# Patient Record
Sex: Male | Born: 1960 | Race: White | Hispanic: No | Marital: Married | Smoking: Never smoker
Health system: Southern US, Academic
[De-identification: ages and names within clinical notes are randomized; demographics above are authoritative.]

## PROBLEM LIST (undated history)

## (undated) DIAGNOSIS — N200 Calculus of kidney: Secondary | ICD-10-CM

## (undated) DIAGNOSIS — Z87448 Personal history of other diseases of urinary system: Secondary | ICD-10-CM

## (undated) DIAGNOSIS — M109 Gout, unspecified: Secondary | ICD-10-CM

## (undated) DIAGNOSIS — I10 Essential (primary) hypertension: Secondary | ICD-10-CM

## (undated) DIAGNOSIS — E785 Hyperlipidemia, unspecified: Secondary | ICD-10-CM

## (undated) DIAGNOSIS — E119 Type 2 diabetes mellitus without complications: Secondary | ICD-10-CM

## (undated) DIAGNOSIS — Z973 Presence of spectacles and contact lenses: Secondary | ICD-10-CM

## (undated) DIAGNOSIS — E7800 Pure hypercholesterolemia, unspecified: Secondary | ICD-10-CM

## (undated) DIAGNOSIS — I209 Angina pectoris, unspecified: Secondary | ICD-10-CM

## (undated) HISTORY — PX: HX WISDOM TEETH EXTRACTION: SHX21

## (undated) HISTORY — PX: FINGER SURGERY: SHX640

## (undated) HISTORY — PX: COLONOSCOPY: SHX174

## (undated) HISTORY — PX: LITHOTRIPSY: SUR834

## (undated) HISTORY — PX: REPLANTATION FINGER: SUR1229

---

## 1998-02-28 ENCOUNTER — Emergency Department (HOSPITAL_COMMUNITY): Admission: EM | Admit: 1998-02-28 | Discharge: 1998-02-28 | Payer: Self-pay | Admitting: *Deleted

## 1998-02-28 ENCOUNTER — Encounter: Payer: Self-pay | Admitting: Emergency Medicine

## 1998-03-19 ENCOUNTER — Encounter: Payer: Self-pay | Admitting: Orthopedic Surgery

## 1998-03-19 ENCOUNTER — Ambulatory Visit (HOSPITAL_COMMUNITY): Admission: RE | Admit: 1998-03-19 | Discharge: 1998-03-19 | Payer: Self-pay | Admitting: Orthopedic Surgery

## 2001-11-23 ENCOUNTER — Encounter: Payer: Self-pay | Admitting: Orthopedic Surgery

## 2001-11-23 ENCOUNTER — Ambulatory Visit (HOSPITAL_COMMUNITY): Admission: RE | Admit: 2001-11-23 | Discharge: 2001-11-23 | Payer: Self-pay | Admitting: Orthopedic Surgery

## 2002-10-24 ENCOUNTER — Emergency Department (HOSPITAL_COMMUNITY): Admission: EM | Admit: 2002-10-24 | Discharge: 2002-10-24 | Payer: Self-pay | Admitting: Emergency Medicine

## 2002-10-24 ENCOUNTER — Encounter: Payer: Self-pay | Admitting: Emergency Medicine

## 2004-07-26 ENCOUNTER — Encounter: Admission: RE | Admit: 2004-07-26 | Discharge: 2004-07-26 | Payer: Self-pay | Admitting: Rheumatology

## 2007-02-23 ENCOUNTER — Ambulatory Visit: Payer: Self-pay | Admitting: Surgery

## 2007-02-23 ENCOUNTER — Ambulatory Visit (HOSPITAL_COMMUNITY): Admission: RE | Admit: 2007-02-23 | Discharge: 2007-02-23 | Payer: Self-pay | Admitting: Family Medicine

## 2010-05-29 ENCOUNTER — Emergency Department (HOSPITAL_COMMUNITY)
Admission: EM | Admit: 2010-05-29 | Discharge: 2010-05-29 | Disposition: A | Discharge status: Home / Self Care | Payer: BLUE CROSS/BLUE SHIELD | Attending: Emergency Medicine | Admitting: Emergency Medicine

## 2010-05-29 ENCOUNTER — Emergency Department (HOSPITAL_COMMUNITY): Payer: BLUE CROSS/BLUE SHIELD

## 2010-05-29 DIAGNOSIS — R300 Dysuria: Secondary | ICD-10-CM | POA: Insufficient documentation

## 2010-05-29 DIAGNOSIS — K5730 Diverticulosis of large intestine without perforation or abscess without bleeding: Secondary | ICD-10-CM | POA: Insufficient documentation

## 2010-05-29 DIAGNOSIS — K4090 Unilateral inguinal hernia, without obstruction or gangrene, not specified as recurrent: Secondary | ICD-10-CM | POA: Insufficient documentation

## 2010-05-29 DIAGNOSIS — N200 Calculus of kidney: Secondary | ICD-10-CM | POA: Insufficient documentation

## 2010-05-29 DIAGNOSIS — R109 Unspecified abdominal pain: Secondary | ICD-10-CM | POA: Insufficient documentation

## 2010-05-29 LAB — URINALYSIS, ROUTINE W REFLEX MICROSCOPIC
Bilirubin Urine: NEGATIVE
Glucose, UA: NEGATIVE mg/dL
Ketones, ur: NEGATIVE mg/dL
Leukocytes, UA: NEGATIVE
Nitrite: NEGATIVE
Protein, ur: NEGATIVE mg/dL
Specific Gravity, Urine: 1.018 (ref 1.005–1.030)
Urobilinogen, UA: 0.2 mg/dL (ref 0.0–1.0)
pH: 5.5 (ref 5.0–8.0)

## 2010-05-29 LAB — URINE MICROSCOPIC-ADD ON

## 2010-07-08 NOTE — H&P (Signed)
NAME:  [name], [name]                      ACCOUNT NO.:  [id number]   MEDICAL RECORD NO.:  [id number]                   PATIENT TYPE:  EMS   LOCATION:  [facility]                                 FACILITY:  [facility]   PHYSICIAN:  [name], M.D.             DATE OF BIRTH:  01/20/1961   DATE OF ADMISSION:  10/24/2002  DATE OF DISCHARGE:  10/24/2002                                HISTORY & PHYSICAL   CHIEF COMPLAINT:  Left hand injury.   HISTORY OF PRESENT ILLNESS:  [name] is a 50 year old right hand  dominant gentleman who presents to the [facility] emergency room after  sustaining an on the job injury to his left small finger.  The patient  states at approximately 7:45 a.m. he unfortunately caught the tip of his  left small finger in a dye press at work [employer]).  He was  brought to the [facility] emergency room for further evaluation and  treatment.  His Tetanus is up to date as he states he had it two or three  years ago.   PAST MEDICAL HISTORY:  Significant for gout with some current symptoms in  his shoulders.   PAST SURGICAL HISTORY:  None.   FAMILY PHYSICIAN:  He obtains his primary care at [facility].   MEDICATIONS:  Vioxx daily.   ALLERGIES:  No known drug allergies but he is allergic to PEANUTS.   SOCIAL HISTORY:  He is single with no children.  Does not use tobacco.  He  does utilize smokeless tobacco.   FAMILY HISTORY:  Father with myocardial infarction.   REVIEW OF SYSTEMS:  Noncontributory.   PHYSICAL EXAMINATION:  VITAL SIGNS: Blood pressure is 140/96, pulse 90,  respirations 22 and temperature is 98.4.  FOCUS EXAMINATION: As this is a hand consultation visit, shows that he has  an obvious open fracture about the left small finger with a gaping skin and  soft tissue laceration noted with exposed  bone.  He is noted to have refill  to the tip, his sensation is intact, FDS and FDP are intact.   Radiographs obtained show  an open comminuted DIP fracture.   ASSESSMENT:  On the job injury sustaining an open distal interphalangeal  fracture.   PLAN:  After discussing with Mr. Aumiller the necessity for repair as well  as the risk including bleeding, infection, anesthesia, damage to normal  structures and has elected to undergo the following procedure was performed  in the emergency room setting.  1. Treatment of an open fracture.  2. Copious irrigation and debridement.  3. Nail plate removal.  4. Nail bed repair.  5. Volar flap advancement with repair of skin and soft tissues.   PROCEDURE IN DETAIL:  A local digital block utilizing 5 cc of Marcaine and 5  cc of lidocaine without epinephrine was administered.  He tolerated this  well.  The hand was then prepped  and draped in a sterile fashion, Betadine  scrub and pain was used during the sterile preparation.  Copious irrigation  and debridement of small bony particles was then performed without  difficulty.  A Penrose drain was then utilized as a tourniquet and the nail  plate was removed without difficulty.  Following this, repair of the nail  bed was performed with subsequent repair of the soft tissues utilizing 4-0  and 6-0 chromic sutures.  The patient tolerated this very well and was  placed in soft dressings and a malleable splint.   He will return to see our therapist in five days, and return to see [place] in  seven days.  During the interim he has been given a prescription for Keflex,  Robaxin and Percocet and will call us for any questions or concerns.      Karie Chimera, P.A.-C.                   [name], M.D.    BB/MEDQ  D:  10/29/2002  T:  10/30/2002  Job:  [id number]

## 2011-12-03 ENCOUNTER — Ambulatory Visit (INDEPENDENT_AMBULATORY_CARE_PROVIDER_SITE_OTHER): Payer: BLUE CROSS/BLUE SHIELD | Admitting: Family Medicine

## 2011-12-03 VITALS — BP 134/92 | HR 72 | Temp 97.9°F | Resp 16 | Ht 73.0 in | Wt 283.0 lb

## 2011-12-03 DIAGNOSIS — Z0000 Encounter for general adult medical examination without abnormal findings: Secondary | ICD-10-CM

## 2011-12-03 DIAGNOSIS — M109 Gout, unspecified: Secondary | ICD-10-CM

## 2011-12-03 LAB — POCT CBC
Granulocyte percent: 64.4 %G (ref 37–80)
HCT, POC: 50.4 % (ref 43.5–53.7)
Hemoglobin: 16.1 g/dL (ref 14.1–18.1)
Lymph, poc: 2 (ref 0.6–3.4)
MCH, POC: 30.9 pg (ref 27–31.2)
MCHC: 31.9 g/dL (ref 31.8–35.4)
MCV: 96.8 fL (ref 80–97)
MID (cbc): 0.4 (ref 0–0.9)
MPV: 9.5 fL (ref 0–99.8)
POC Granulocyte: 4.4 (ref 2–6.9)
POC LYMPH PERCENT: 29.5 %L (ref 10–50)
POC MID %: 6.1 % (ref 0–12)
Platelet Count, POC: 274 10*3/uL (ref 142–424)
RBC: 5.21 M/uL (ref 4.69–6.13)
RDW, POC: 13.1 %
WBC: 6.9 10*3/uL (ref 4.6–10.2)

## 2011-12-03 LAB — COMPREHENSIVE METABOLIC PANEL
BUN: 13 mg/dL (ref 6–23)
CO2: 29 mEq/L (ref 19–32)
Creat: 0.73 mg/dL (ref 0.50–1.35)
Glucose, Bld: 152 mg/dL — ABNORMAL HIGH (ref 70–99)
Total Bilirubin: 0.5 mg/dL (ref 0.3–1.2)
Total Protein: 7.1 g/dL (ref 6.0–8.3)

## 2011-12-03 LAB — LIPID PANEL
Cholesterol: 217 mg/dL — ABNORMAL HIGH (ref 0–200)
HDL: 39 mg/dL — ABNORMAL LOW (ref >39)
LDL Cholesterol: 140 mg/dL — ABNORMAL HIGH (ref 0–99)
Total CHOL/HDL Ratio: 5.6 Ratio
Triglycerides: 189 mg/dL — ABNORMAL HIGH (ref <150)
VLDL: 38 mg/dL (ref 0–40)

## 2011-12-03 LAB — PSA: PSA: 0.55 ng/mL (ref <=4.00)

## 2011-12-03 LAB — IFOBT (OCCULT BLOOD): IFOBT: NEGATIVE

## 2011-12-03 LAB — POCT GLYCOSYLATED HEMOGLOBIN (HGB A1C): Hemoglobin A1C: 8.9

## 2011-12-03 LAB — COMPREHENSIVE METABOLIC PANEL WITH GFR
ALT: 27 U/L (ref 0–53)
AST: 19 U/L (ref 0–37)
Albumin: 4.4 g/dL (ref 3.5–5.2)
Alkaline Phosphatase: 68 U/L (ref 39–117)
Calcium: 9.8 mg/dL (ref 8.4–10.5)
Chloride: 102 meq/L (ref 96–112)
Potassium: 4.7 meq/L (ref 3.5–5.3)
Sodium: 139 meq/L (ref 135–145)

## 2011-12-03 LAB — URIC ACID: Uric Acid, Serum: 5.1 mg/dL (ref 4.0–7.8)

## 2011-12-03 MED ORDER — FEBUXOSTAT 80 MG PO TABS
1.0000 | ORAL_TABLET | Freq: Every day | ORAL | Status: DC
Start: 2011-12-03 — End: 2012-07-09

## 2011-12-03 MED ORDER — FEBUXOSTAT 40 MG PO TABS: 80.0000 mg | ORAL_TABLET | Freq: Every day | ORAL | Status: DC

## 2011-12-03 NOTE — Progress Notes (Signed)
  [facility]:  Office Visit  Chief Complaint:  Chief Complaint  Patient presents with  . Annual Exam  . Medication Refill    HPI: Ray Young is a 51 y.o. male who complains of annual exam. No h/o colon or prostate cancer. Father has had melanoma of the face. Patient has no complaints.   Past Medical History  Diagnosis Date  . Gout   . Arthritis    Past Surgical History  Procedure Date  . Reattachment of finger     left pinky finger 2003   History   Social History  . Marital Status: Single    Spouse Name: N/A    Number of Children: N/A  . Years of Education: N/A   Social History Main Topics  . Smoking status: Never Smoker   . Smokeless tobacco: None  . Alcohol Use: No  . Drug Use: No  . Sexually Active: None   Other Topics Concern  . None   Social History Narrative  . None   Family History  Problem Relation Age of Onset  . Heart disease Father    No Known Allergies Prior to Admission medications   Medication Sig Start Date End Date Taking? Authorizing Provider  febuxostat (ULORIC) 40 MG tablet Take 80 mg by mouth daily.   Yes Historical Provider, MD  Multiple Vitamins-Minerals (CENTRUM SILVER ULTRA MENS PO) Take by mouth.   Yes Historical Provider, MD     ROS: The patient denies fevers, chills, night sweats, unintentional weight loss, chest pain, palpitations, wheezing, dyspnea on exertion, nausea, vomiting, abdominal pain, dysuria, hematuria, melena, numbness, weakness, or tingling.   All other systems have been reviewed and were otherwise negative with the exception of those mentioned in the HPI and as above.    PHYSICAL EXAM: Filed Vitals:   12/03/11 1128  BP: 134/92  Pulse: 72  Temp: 97.9 F (36.6 C)  Resp: 16   Filed Vitals:   12/03/11 1128  Height: 6\' 1"  (1.854 m)  Weight: 283 lb (128.368 kg)   Body mass index is 37.34 kg/(m^2).  General: Alert, no acute distress, obese white male HEENT:  Normocephalic,  atraumatic, oropharynx patent. EOMI, PERRLA, fundoscopic exam nl Cardiovascular:  Regular rate and rhythm, no rubs murmurs or gallops.  No Carotid bruits, radial pulse intact. No pedal edema.  Respiratory: Clear to auscultation bilaterally.  No wheezes, rales, or rhonchi.  No cyanosis, no use of accessory musculature GI: No organomegaly, abdomen is soft and non-tender, positive bowel sounds.  No masses. Skin: No rashes. Neurologic: Facial musculature symmetric. Psychiatric: Patient is appropriate throughout our interaction. Lymphatic: No cervical lymphadenopathy Musculoskeletal: Gait intact. GU -nl, rectum nl, prostate nl   LABS:    EKG/XRAY:   Primary read interpreted by Dr. Conley Rolls at [facility].   ASSESSMENT/PLAN: Encounter Diagnoses  Name Primary?  . Annual physical exam Yes  . Gout    Patient doing well.  Labs pending Refer to get screening colonoscopy Patient take Uloric 80 mg daily. Refill x 11    Odis Turck PHUONG, DO 12/04/2011 12:00 PM

## 2011-12-04 ENCOUNTER — Telehealth: Payer: Self-pay | Admitting: Radiology

## 2011-12-04 MED ORDER — METFORMIN HCL 1000 MG PO TABS: 1000.0000 mg | ORAL_TABLET | Freq: Two times a day (BID) | ORAL | Status: DC

## 2011-12-04 NOTE — Telephone Encounter (Signed)
Patient called and advised labs indicate Diabetes, he is advised the Rx for Metformin sent in for him and he needs return office visit in 3 mo. He is advised meds may cause GI upset/ Nausea and diarrhea, he is advised taking with food may help this and he will adjust to this, and GI side effects will decrease with time.

## 2011-12-22 ENCOUNTER — Encounter: Payer: Self-pay | Admitting: Family Medicine

## 2011-12-26 ENCOUNTER — Other Ambulatory Visit: Payer: Self-pay | Admitting: Family Medicine

## 2011-12-26 DIAGNOSIS — E785 Hyperlipidemia, unspecified: Secondary | ICD-10-CM

## 2011-12-26 MED ORDER — ATORVASTATIN CALCIUM 40 MG PO TABS: 40.0000 mg | ORAL_TABLET | Freq: Every day | ORAL | Status: DC

## 2012-03-03 IMAGING — CT CT ABD-PELV W/O CM
2 of 4 series · 16 of 46 positions shown, 18 images · non-contrast
Comparison: None.

CLINICAL DATA: Acute onset of right lower quadrant abdominal pain

CT ABDOMEN AND PELVIS WITHOUT CONTRAST
TECHNIQUE: Multidetector CT imaging of the abdomen and pelvis was
performed following the standard protocol without intravenous
contrast.

[Series 2: stone study · axial · 0.88mm/px · z∈[-553,-58]mm · 13 of 111 slices shown, 15 images]
[im 6/111  soft-tissue]
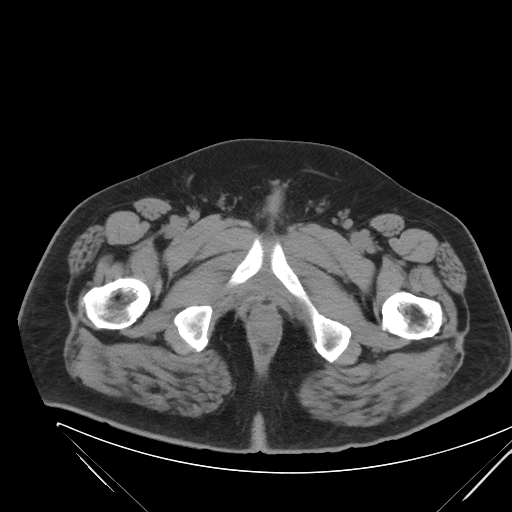
[im 6/111  bone]
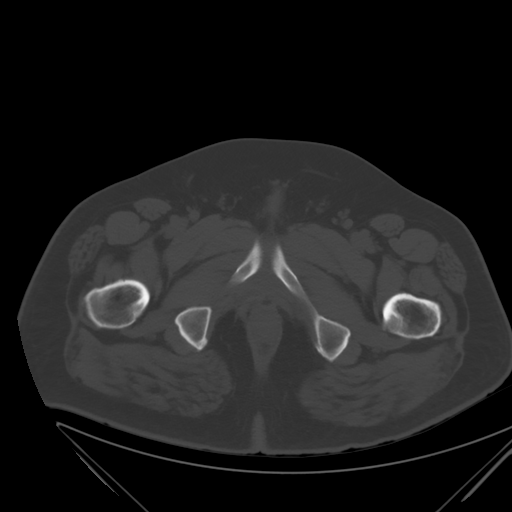
[im 18/111  soft-tissue]
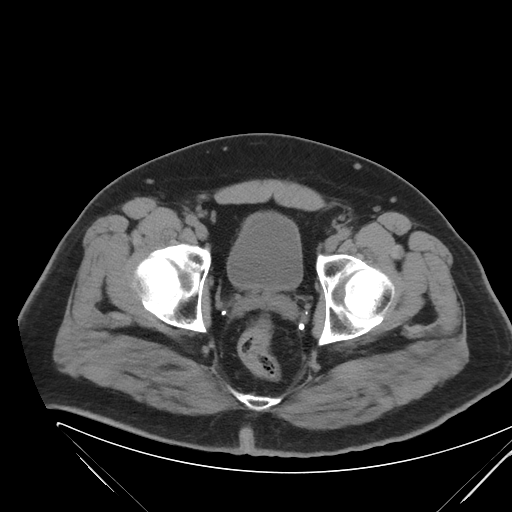
[im 24/111  soft-tissue]
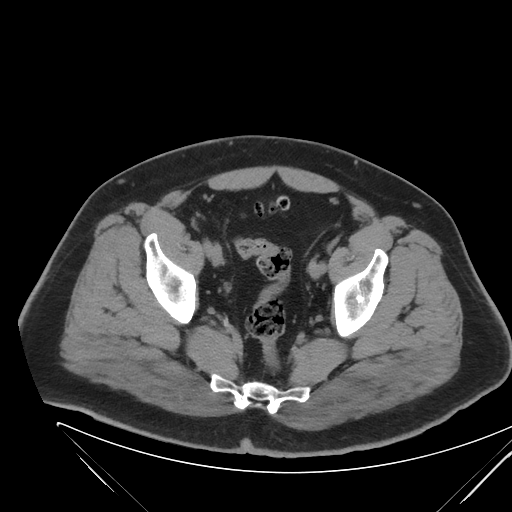
[im 29/111  soft-tissue]
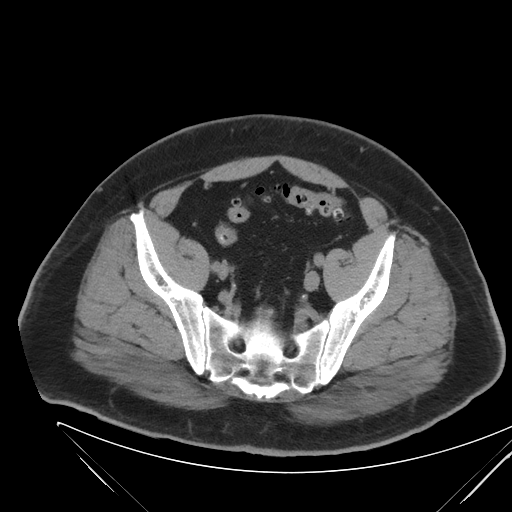
[im 41/111  soft-tissue]
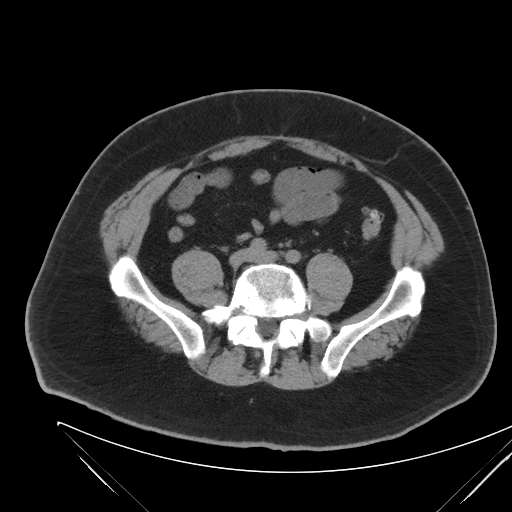
[im 47/111  soft-tissue]
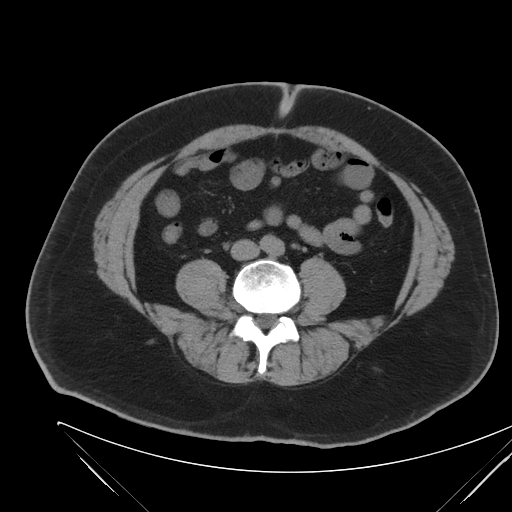
[im 58/111  soft-tissue]
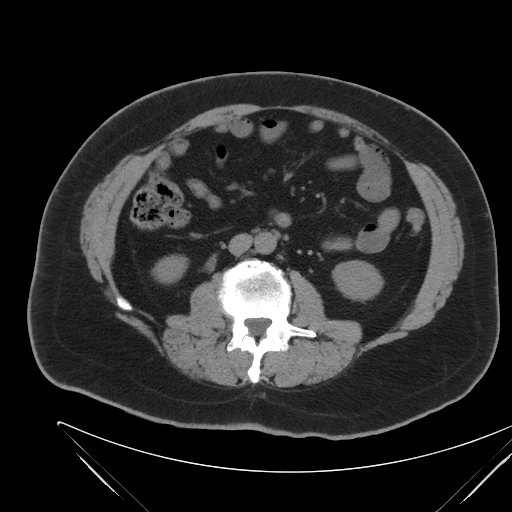
[im 64/111  soft-tissue]
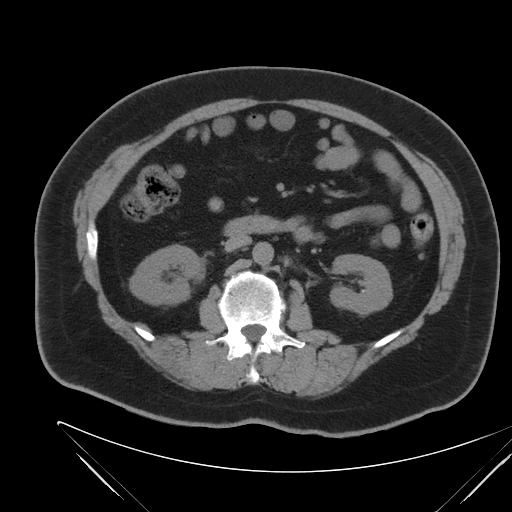
[im 70/111  soft-tissue]
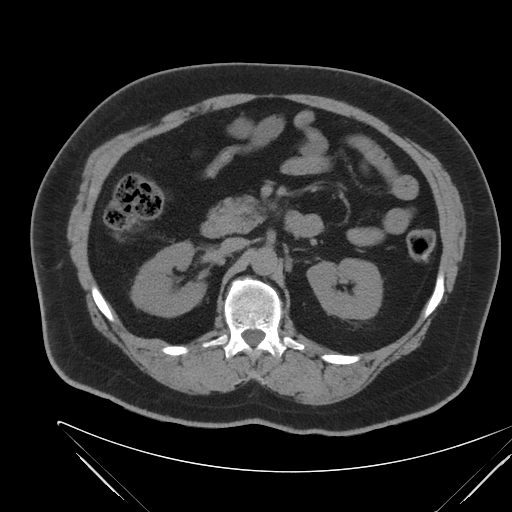
[im 70/111  bone]
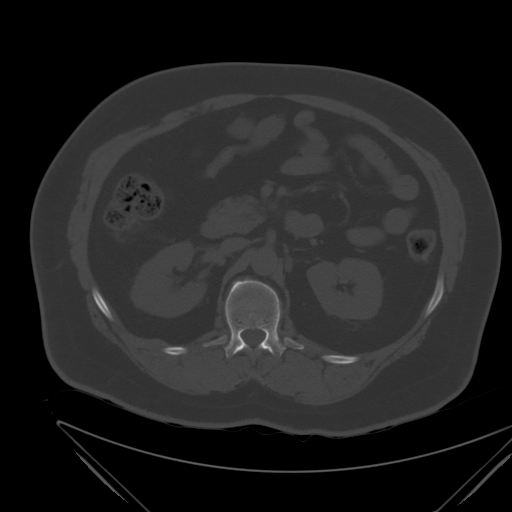
[im 82/111  soft-tissue]
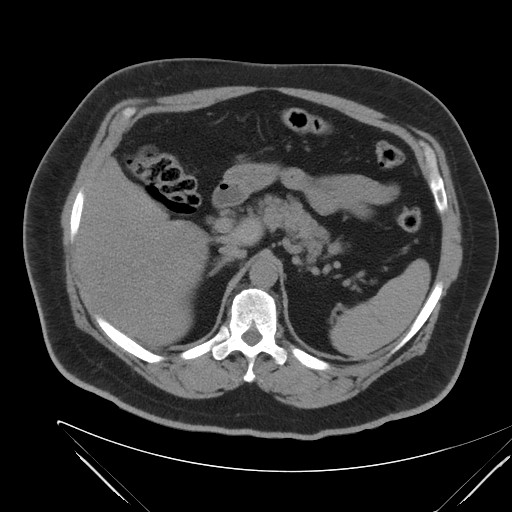
[im 87/111  soft-tissue]
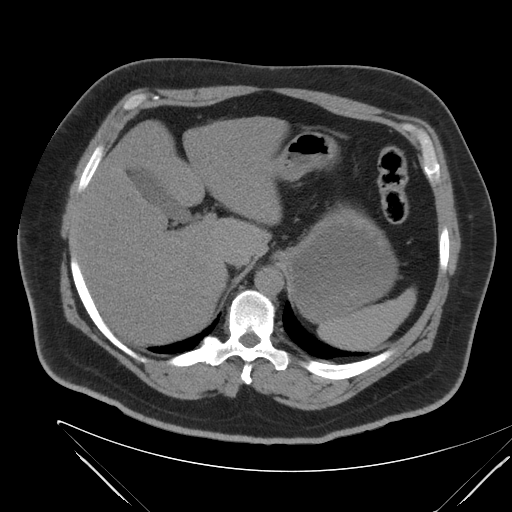
[im 93/111  soft-tissue]
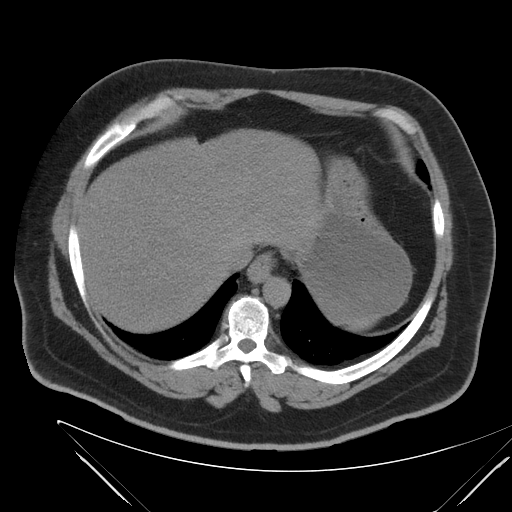
[im 105/111  soft-tissue]
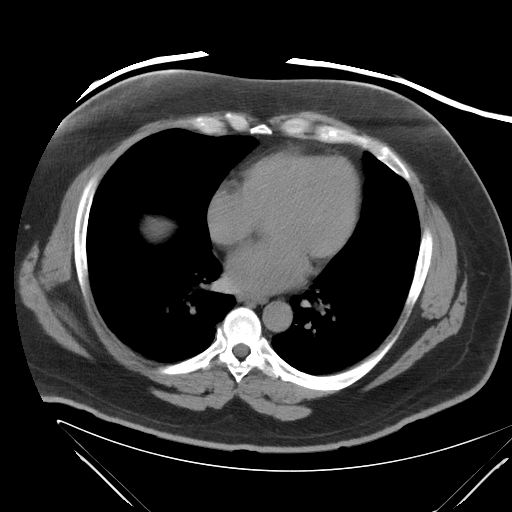

[mpr, coronals, coronal · coronal · 1.07mm/px · 3 of 126 slices shown]
[im 42/126  soft-tissue]
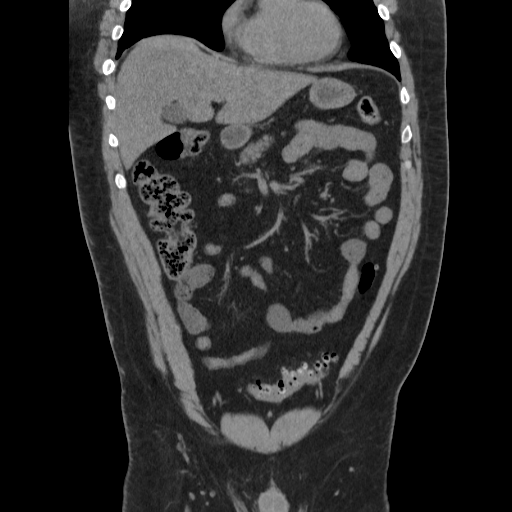
[im 56/126  soft-tissue]
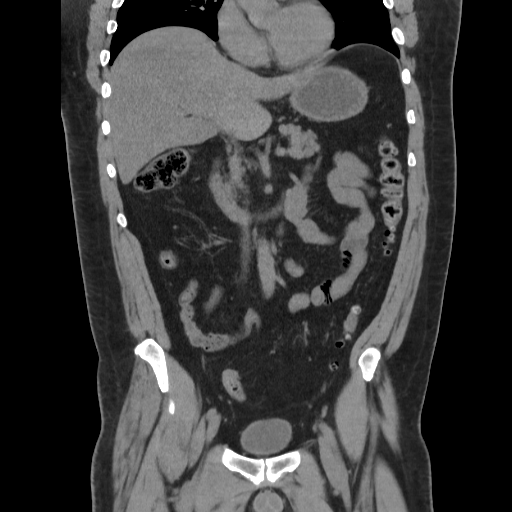
[im 70/126  soft-tissue]
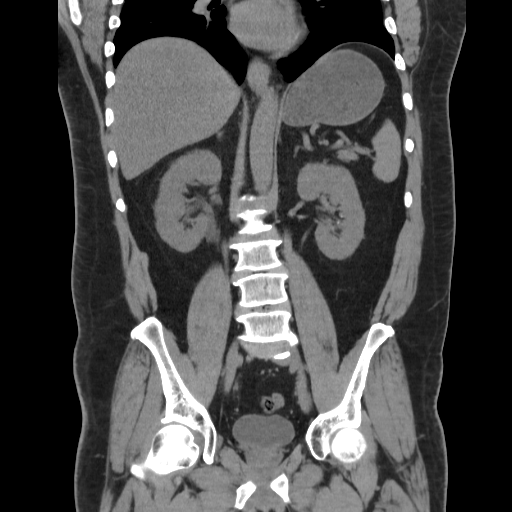

[16 of 46 positions shown; findings below may reference images not displayed]

FINDINGS: Mild bibasilar atelectasis is noted.

The liver and spleen are unremarkable in appearance.  The
gallbladder is within normal limits.  The pancreas and adrenal
glands are unremarkable.

There is right-sided pelvicaliectasis, with right-sided perinephric
stranding.  Stranding is noted along the course of the right
ureter.  No obstructing ureteral stone is identified.  Within the
bladder, there is a 3 mm recently passed stone.  Scattered
additional tiny nonobstructing stones are noted within both
kidneys, measuring up to 2 mm in size.

No free fluid is identified.  The small bowel is unremarkable in
appearance.  The stomach is within normal limits.  No acute
vascular abnormalities are seen.

The appendix is difficult to fully characterize but appears normal
in caliber, without evidence for appendicitis.  Scattered
diverticulosis is noted along the distal descending and proximal
sigmoid colon, without evidence of diverticulitis.

The bladder is mildly distended and is otherwise grossly
unremarkable in appearance; a small urachal remnant is incidentally
noted.  The prostate is normal in size.  A small left inguinal
hernia is noted, containing only fat.  No inguinal lymphadenopathy
is seen.

No acute osseous abnormalities are identified.  Mild irregularity
at the lower thoracic and lumbar vertebral bodies may reflect
sequelae of Scheuermann's disease.  Multilevel vacuum phenomenon is
noted along the lumbar spine.
IMPRESSION: 1.  Right-sided pelvicaliectasis, with right-sided perinephric
stranding.  There is a recently passed 3 mm stone noted within the
bladder; no obstructing ureteral stone is now seen.
2.  Scattered tiny nonobstructing stones within both kidneys,
measuring up to 2 mm in size.
3.  Scattered diverticulosis along the distal descending and
proximal sigmoid colon, without evidence of diverticulitis.
4.  Small left inguinal hernia, containing only fat.

5.  Mild degenerative change noted along the lumbar spine.
6.  Mild bibasilar atelectasis seen.

## 2012-04-06 ENCOUNTER — Other Ambulatory Visit: Payer: Self-pay

## 2012-07-09 ENCOUNTER — Encounter (HOSPITAL_COMMUNITY): Payer: Self-pay | Admitting: *Deleted

## 2012-07-09 ENCOUNTER — Observation Stay (HOSPITAL_COMMUNITY)
Admission: EM | Admit: 2012-07-09 | Discharge: 2012-07-10 | Disposition: A | Discharge status: Home / Self Care | Payer: Worker's Compensation | Attending: Internal Medicine | Admitting: Internal Medicine

## 2012-07-09 ENCOUNTER — Emergency Department (HOSPITAL_COMMUNITY): Payer: Worker's Compensation

## 2012-07-09 ENCOUNTER — Ambulatory Visit (INDEPENDENT_AMBULATORY_CARE_PROVIDER_SITE_OTHER): Payer: BLUE CROSS/BLUE SHIELD | Admitting: Family Medicine

## 2012-07-09 VITALS — BP 120/90 | HR 89 | Temp 98.8°F | Resp 17 | Wt 273.0 lb

## 2012-07-09 DIAGNOSIS — M109 Gout, unspecified: Secondary | ICD-10-CM | POA: Insufficient documentation

## 2012-07-09 DIAGNOSIS — E119 Type 2 diabetes mellitus without complications: Secondary | ICD-10-CM | POA: Insufficient documentation

## 2012-07-09 DIAGNOSIS — E669 Obesity, unspecified: Secondary | ICD-10-CM

## 2012-07-09 DIAGNOSIS — R079 Chest pain, unspecified: Principal | ICD-10-CM | POA: Insufficient documentation

## 2012-07-09 DIAGNOSIS — Z8249 Family history of ischemic heart disease and other diseases of the circulatory system: Secondary | ICD-10-CM | POA: Diagnosis not present

## 2012-07-09 DIAGNOSIS — E7800 Pure hypercholesterolemia, unspecified: Secondary | ICD-10-CM | POA: Diagnosis not present

## 2012-07-09 DIAGNOSIS — E785 Hyperlipidemia, unspecified: Secondary | ICD-10-CM | POA: Insufficient documentation

## 2012-07-09 HISTORY — DX: Type 2 diabetes mellitus without complications: E11.9

## 2012-07-09 HISTORY — DX: Angina pectoris, unspecified: I20.9

## 2012-07-09 HISTORY — DX: Pure hypercholesterolemia, unspecified: E78.00

## 2012-07-09 HISTORY — DX: Gout, unspecified: M10.9

## 2012-07-09 HISTORY — DX: Calculus of kidney: N20.0

## 2012-07-09 LAB — GLUCOSE, CAPILLARY
Glucose-Capillary: 139 mg/dL — ABNORMAL HIGH (ref 70–99)
Glucose-Capillary: 173 mg/dL — ABNORMAL HIGH (ref 70–99)
Glucose-Capillary: 132 mg/dL — ABNORMAL HIGH (ref 70–99)

## 2012-07-09 LAB — CBC WITH DIFFERENTIAL/PLATELET
Basophils Absolute: 0 10*3/uL (ref 0.0–0.1)
Basophils Relative: 0 % (ref 0–1)
Eosinophils Absolute: 0.2 10*3/uL (ref 0.0–0.7)
MCH: 32.9 pg (ref 26.0–34.0)
MCHC: 36.1 g/dL — ABNORMAL HIGH (ref 30.0–36.0)
Neutrophils Relative %: 69 % (ref 43–77)
Platelets: 214 10*3/uL (ref 150–400)
RBC: 5.01 MIL/uL (ref 4.22–5.81)
RDW: 13.1 % (ref 11.5–15.5)

## 2012-07-09 LAB — COMPREHENSIVE METABOLIC PANEL
ALT: 29 U/L (ref 0–53)
Albumin: 4 g/dL (ref 3.5–5.2)
Alkaline Phosphatase: 81 U/L (ref 39–117)
Potassium: 4.3 mEq/L (ref 3.5–5.1)
Sodium: 142 mEq/L (ref 135–145)
Total Protein: 7.7 g/dL (ref 6.0–8.3)

## 2012-07-09 MED ORDER — ASPIRIN EC 325 MG PO TBEC
325.0000 mg | DELAYED_RELEASE_TABLET | Freq: Every day | ORAL | Status: DC
  Administered 2012-07-10: 325 mg via ORAL
  Filled 2012-07-09: qty 1

## 2012-07-09 MED ORDER — MORPHINE SULFATE 2 MG/ML IJ SOLN: 2.0000 mg | INTRAMUSCULAR | Status: DC | PRN

## 2012-07-09 MED ORDER — INSULIN ASPART 100 UNIT/ML ~~LOC~~ SOLN: 0.0000 [IU] | Freq: Every day | SUBCUTANEOUS | Status: DC

## 2012-07-09 MED ORDER — INSULIN ASPART 100 UNIT/ML ~~LOC~~ SOLN
0.0000 [IU] | Freq: Three times a day (TID) | SUBCUTANEOUS | Status: DC
  Administered 2012-07-09: 2 [IU] via SUBCUTANEOUS

## 2012-07-09 MED ORDER — ACETAMINOPHEN 325 MG PO TABS: 650.0000 mg | ORAL_TABLET | Freq: Four times a day (QID) | ORAL | Status: DC | PRN

## 2012-07-09 MED ORDER — SODIUM CHLORIDE 0.9 % IJ SOLN
3.0000 mL | Freq: Two times a day (BID) | INTRAMUSCULAR | Status: DC
  Administered 2012-07-09: 3 mL via INTRAVENOUS

## 2012-07-09 MED ORDER — ATORVASTATIN CALCIUM 40 MG PO TABS
40.0000 mg | ORAL_TABLET | Freq: Every day | ORAL | Status: DC
  Administered 2012-07-09: 40 mg via ORAL
  Filled 2012-07-09 (×2): qty 1

## 2012-07-09 MED ORDER — ACETAMINOPHEN 650 MG RE SUPP: 650.0000 mg | Freq: Four times a day (QID) | RECTAL | Status: DC | PRN

## 2012-07-09 MED ORDER — ENOXAPARIN SODIUM 40 MG/0.4ML ~~LOC~~ SOLN
40.0000 mg | SUBCUTANEOUS | Status: DC
  Filled 2012-07-09 (×2): qty 0.4

## 2012-07-09 MED ORDER — MORPHINE SULFATE 4 MG/ML IJ SOLN
4.0000 mg | Freq: Once | INTRAMUSCULAR | Status: DC
  Filled 2012-07-09: qty 1

## 2012-07-09 MED ORDER — ASPIRIN 81 MG PO CHEW: 324.0000 mg | CHEWABLE_TABLET | Freq: Once | ORAL | Status: DC

## 2012-07-09 MED ORDER — FEBUXOSTAT 80 MG PO TABS
1.0000 | ORAL_TABLET | Freq: Every day | ORAL | Status: DC
  Administered 2012-07-09 – 2012-07-10 (×2): 80 mg via ORAL
  Filled 2012-07-09 (×2): qty 1

## 2012-07-09 NOTE — Progress Notes (Signed)
Utilization review completed.  

## 2012-07-09 NOTE — ED Notes (Signed)
Attempted report 

## 2012-07-09 NOTE — Progress Notes (Signed)
[facility] History and Physical  Ray Young:[id number] DOB: 11-Nov-1960 DOA: 07/09/2012   PCP: No primary provider on file.   Chief Complaint: Chest pain  HPI:  52 year old male with a history of diabetes, hyperlipidemia, and gouty arthritis presents with one-day history of chest discomfort. The patient states that he had chest discomfort around 3 PM yesterday when he was lifting some heavy boxes at work. The patient subsequently associated this with some nausea and diaphoresis. He stated that the chest discomfort was of a "cramp" sensation. He rated it 7/10. Overnight, he stated that the pain persisted but was less. He went to his primary care physician today who urged him to come to the emergency department. In emergency department, initial troponin was negative and EKG was unremarkable. CBC was unremarkable. BMP was unremarkable as well as his LFTs. The patient is currently pain-free. Chest x-ray is negative. The patient's vital signs are stable. Because of his risk factors, admission was advised to rule out ACS. The patient has never smoked. His dad had a heart attack at age 43 and 14. Assessment/Plan: Chest pain -Currently chest pain-free -Serial troponins -Given the patient's risk factors and associated diaphoresis and nausea, consult cardiology for possible stress test -Check hemoglobin A1c, TSH, lipids -Aspirin daily -Place patient on observation on telemetry Diabetes mellitus type 2 -Discontinue metformin for now -Place on NovoLog sliding scale -12/03/2011 hemoglobin A1c 8.9--prior to starting on metformin Hyperlipidemia -Continue atorvastatin -Check lipid panel -Check TSH Gouty arthritis -Continue Uloric        Past Medical History  Diagnosis Date  . Gout   . Arthritis   . Diabetes mellitus without complication   . High cholesterol    Past Surgical History  Procedure Laterality Date  . Reattachment of finger      left pinky finger 2003  .  Fracture surgery     Social History:  reports that he has never smoked. He uses smokeless tobacco. He reports that he does not drink alcohol or use illicit drugs.   Family History  Problem Relation Age of Onset  . Heart disease Father      No Known Allergies    Prior to Admission medications   Medication Sig Start Date End Date Taking? Authorizing Provider  Febuxostat 80 MG TABS Take 1 tablet by mouth daily. 12/03/11  Yes Thao P Le, DO  ibuprofen (ADVIL,MOTRIN) 200 MG tablet Take 200 mg by mouth every 6 (six) hours as needed for pain.   Yes Historical Provider, MD  metFORMIN (GLUCOPHAGE) 1000 MG tablet Take 500 mg by mouth 2 (two) times daily with a meal.   Yes Historical Provider, MD  atorvastatin (LIPITOR) 40 MG tablet Take 40 mg by mouth at bedtime. 12/26/11   Thao P Le, DO  Multiple Vitamins-Minerals (CENTRUM SILVER ULTRA MENS PO) Take 1 tablet by mouth daily.     Historical Provider, MD    Review of Systems:  Constitutional:  No weight loss, night sweats, Fevers, chills, fatigue.  Head&Eyes: No headache.  No vision loss.  No eye pain or scotoma ENT:  No Difficulty swallowing,Tooth/dental problems,Sore throat,  No ear ache, post nasal drip,  Cardio-vascular:  No Orthopnea, PND, swelling in lower extremities,  dizziness, palpitations  GI:  No  abdominal pain,vomiting, diarrhea, loss of appetite, hematochezia, melena, heartburn, indigestion, Resp:  No shortness of breath with exertion or at rest. No cough. No coughing up of blood .No wheezing.No chest wall deformity  Skin:  no rash or lesions.  GU:  no dysuria, change in color of urine, no urgency or frequency. No flank pain.  Musculoskeletal:  No joint pain or swelling. No decreased range of motion. No back pain.  Psych:  No change in mood or affect. No depression or anxiety. Neurologic: No headache, no dysesthesia, no focal weakness, no vision loss. No syncope  Physical Exam: Filed Vitals:   07/09/12 1200  07/09/12 1215 07/09/12 1230 07/09/12 1300  BP: 133/77 131/76 119/71 128/73  Pulse: 70 63 59 64  Temp:      TempSrc:      Resp: 17 13 14 11   Height:      Weight:      SpO2: 96% 98% 99% 98%   General:  A&O x 3, NAD, nontoxic, pleasant/cooperative Head/Eye: No conjunctival hemorrhage, no icterus, Gabbs/AT, No nystagmus ENT:  No icterus,  No thrush, good dentition, no pharyngeal exudate Neck:  No masses, no lymphadenpathy, no bruits CV:  RRR, no rub, no gallop, no S3 Lung:  CTAB, good air movement, no wheeze, no rhonchi Abdomen: soft/NT, +BS, nondistended, no peritoneal signs Ext: No cyanosis, No rashes, No petechiae, No lymphangitis, No edema   Labs on Admission:  Basic Metabolic Panel:  Recent Labs Lab 07/09/12 1103  NA 142  K 4.3  CL 103  CO2 26  GLUCOSE 183*  BUN 12  CREATININE 0.73  CALCIUM 9.8   Liver Function Tests:  Recent Labs Lab 07/09/12 1103  AST 21  ALT 29  ALKPHOS 81  BILITOT 0.5  PROT 7.7  ALBUMIN 4.0   No results found for this basename: LIPASE, AMYLASE,  in the last 168 hours No results found for this basename: AMMONIA,  in the last 168 hours CBC:  Recent Labs Lab 07/09/12 1103  WBC 7.5  NEUTROABS 5.2  HGB 16.5  HCT 45.7  MCV 91.2  PLT 214   Cardiac Enzymes: No results found for this basename: CKTOTAL, CKMB, CKMBINDEX, TROPONINI,  in the last 168 hours BNP: No components found with this basename: POCBNP,  CBG: No results found for this basename: GLUCAP,  in the last 168 hours  Radiological Exams on Admission: Dg Chest 2 View  07/09/2012   *RADIOLOGY REPORT*  Clinical Data: Chest pain  CHEST - 2 VIEW  Comparison: May 16, 2005  Findings:  Lungs clear.  Heart size and pulmonary vascularity are normal.  No adenopathy.  No pneumothorax.  No bone lesions.  IMPRESSION: No abnormality noted.   Original Report Authenticated By: Bretta Bang, M.D.    EKG: Independently reviewed. Sinus rhythm, no ST-T wave change    Time spent:60  minutes Code Status:   FULL Family Communication:   wife at bedside   Deiondre Harrower, DO  [facility] Pager [phone]  If 7PM-7AM, please contact night-coverage [email or web address] Password [email or web address] 07/09/2012, 1:06 PM

## 2012-07-09 NOTE — ED Provider Notes (Signed)
History     CSN: [id number]  Arrival date & time 07/09/12  1036   First MD Initiated Contact with Patient 07/09/12 1051      Chief Complaint  Patient presents with  . Chest Pain    (Consider location/radiation/quality/duration/timing/severity/associated sxs/prior treatment) HPI Comments: 52 y.o. Male w/ pmh of diabetes, high cholesterol -- family hx of father with multiple MI's, first one when his father was 51 -- who presents to the ER w/ the cc of chest discomfort. Pt was seen at urgent care facility this morning, and then sent to ER for further evaluation. Pt states that symptoms started yesterday afternoon after he was lifting heavy boxes -- he had substernal chest pain, and he remarks he suddenly got "very sweaty for about an hour". His symptoms improved so he did not come to the ER, but overnight he continued to have "chest discomfort". He currently continues to have chest discomfort. He appears comfortable.   Patient is a 52 y.o. male presenting with general illness. The history is provided by the patient.  Illness Severity:  Mild Onset quality:  Sudden Timing:  Intermittent Progression:  Improving Associated symptoms: chest pain   Associated symptoms: no abdominal pain, no diarrhea, no headaches, no nausea, no rash, no shortness of breath and no wheezing     Past Medical History  Diagnosis Date  . Gout   . Arthritis   . Diabetes mellitus without complication   . High cholesterol     Past Surgical History  Procedure Laterality Date  . Reattachment of finger      left pinky finger 2003  . Fracture surgery      Family History  Problem Relation Age of Onset  . Heart disease Father     History  Substance Use Topics  . Smoking status: Never Smoker   . Smokeless tobacco: Current User  . Alcohol Use: No      Review of Systems  Constitutional: Negative for chills and activity change.  HENT: Negative for drooling, mouth sores and neck pain.   Eyes: Negative  for pain and discharge.  Respiratory: Positive for chest tightness. Negative for shortness of breath and wheezing.   Cardiovascular: Positive for chest pain.  Gastrointestinal: Negative for nausea, abdominal pain, diarrhea and constipation.  Genitourinary: Negative for dysuria, flank pain and difficulty urinating.  Musculoskeletal: Negative for back pain and arthralgias.  Skin: Negative for color change, pallor and rash.  Neurological: Negative for dizziness, weakness, light-headedness and headaches.  Psychiatric/Behavioral: Negative for behavioral problems, confusion and agitation.    Allergies  Review of patient's allergies indicates no known allergies.  Home Medications   Current Outpatient Rx  Name  Route  Sig  Dispense  Refill  . atorvastatin (LIPITOR) 40 MG tablet   Oral   Take 1 tablet (40 mg total) by mouth at bedtime.   30 tablet   5   . Febuxostat (ULORIC) 80 MG TABS   Oral   Take 1 tablet (80 mg total) by mouth daily.   30 tablet   11   . metFORMIN (GLUCOPHAGE) 1000 MG tablet   Oral   Take 1 tablet (1,000 mg total) by mouth 2 (two) times daily with a meal.   180 tablet   2   . Multiple Vitamins-Minerals (CENTRUM SILVER ULTRA MENS PO)   Oral   Take by mouth.           BP 144/99  Pulse 79  Temp(Src) 99.8 F (37.7 C) (Oral)  Ht 6\' 2"  (1.88 m)  Wt 270 lb (122.471 kg)  BMI 34.65 kg/m2  SpO2 100%  Physical Exam  Constitutional: He is oriented to person, place, and time. He appears well-developed. No distress.  HENT:  Head: Normocephalic.  Eyes: Pupils are equal, round, and reactive to light. Right eye exhibits no discharge. Left eye exhibits no discharge.  Neck: Neck supple. No tracheal deviation present.  Cardiovascular: Regular rhythm and intact distal pulses.   Pulmonary/Chest: Effort normal. No stridor. No respiratory distress. He has no wheezes.  Reproducible chest wall ttp  Abdominal: Soft. He exhibits no distension. There is no tenderness.  There is no rebound.  Musculoskeletal: Normal range of motion. He exhibits no tenderness.  Neurological: He is alert and oriented to person, place, and time. No cranial nerve deficit.  Skin: Skin is warm. No rash noted. He is not diaphoretic. No erythema.    ED Course  Procedures (including critical care time)  Labs Reviewed  CBC WITH DIFFERENTIAL - Abnormal; Notable for the following:    MCHC 36.1 (*)    All other components within normal limits  COMPREHENSIVE METABOLIC PANEL - Abnormal; Notable for the following:    Glucose, Bld 183 (*)    All other components within normal limits  GLUCOSE, CAPILLARY - Abnormal; Notable for the following:    Glucose-Capillary 139 (*)    All other components within normal limits  POCT I-STAT TROPONIN I   Dg Chest 2 View  07/09/2012   *RADIOLOGY REPORT*  Clinical Data: Chest pain  CHEST - 2 VIEW  Comparison: May 16, 2005  Findings:  Lungs clear.  Heart size and pulmonary vascularity are normal.  No adenopathy.  No pneumothorax.  No bone lesions.  IMPRESSION: No abnormality noted.   Original Report Authenticated By: Bretta Bang, M.D.    ECG shows NSR, 72, do not see ST wave changes consistent with STEMI, and do not see inverted T waves.   MDM  Even though pt does have some reproducible symptoms on exam, his story, mixed with risk factors (diabetes, significant family hx), is concerning enough that pt will need trops, rule out, and consideration for stress testing.   Trop is neg. Pt is admitted to medicine team for further eval and care.    1. Obesity, unspecified   2. Chest pain   3. Hyperlipidemia   4. Type II or unspecified type diabetes mellitus without mention of complication, not stated as uncontrolled           Bernadene Person, MD 07/09/12 1503

## 2012-07-09 NOTE — ED Notes (Signed)
Pt here per [facility] with complaint of epigastric pain that started at 13:00 yesterday while he was lifting heavy boxes and lasted through the evening. Pt went to urget care today who called [facility] for pt. To be transferred to [facility] ED.  Pt given ASA 324 mg and PIV started in left hand.

## 2012-07-09 NOTE — Consult Note (Signed)
Reason for Consult: Chest pain Referring Physician: [facility]  Ray Young is an 52 y.o. male.  HPI: Patient is 52 year old male with past medical history significant for non-insulin-dependent diabetes matters, hypercholesteremia, gouty arthritis, was admitted because of recurrent chest pain in the retrosternal area localized since yesterday grade 7/10 associated with diaphoresis states chest pain was cramping in nature states pain started after lifting heavy boxes at work. Went to urgent care today and was advised to come to the ER. Patient states occasionally chest pain gets worse with movement. Denies any shortness of breath. Denies any palpitation lightheadedness or syncope denies any PND orthopnea leg swelling. Denies any cardiac workup in the past states his father had first MI in his 72s and then subsequent had another MI just recently. Denies any cardiac workup in the past. EKG done in the ER showed no acute ischemic changes and his cardiac enzymes so far has been negative.  Past Medical History  Diagnosis Date  . High cholesterol     "dx'd 11/2011" (Jul 20, 2012)  . Kidney stone     "passed on it's own" (2012-07-20)  . Anginal pain     "last 2 days" (07/20/12)  . Gouty arthritis   . Type II diabetes mellitus     "dx'd 11/2011" (07-20-2012)    Past Surgical History  Procedure Laterality Date  . Replantation finger Left ~ 2003    "pinky; got mashed; had to pull bone out and reattach tip" (07/20/12)    Family History  Problem Relation Age of Onset  . Heart disease Father     Social History:  reports that he has never smoked. His smokeless tobacco use includes Chew. He reports that he does not drink alcohol or use illicit drugs.  Allergies: No Known Allergies  Medications: I have reviewed the patient's current medications.  Results for orders placed during the hospital encounter of 2012-07-20 (from the past 48 hour(s))  CBC WITH DIFFERENTIAL     Status: Abnormal    Collection Time    07/20/2012 11:03 AM      Result Value Range   WBC 7.5  4.0 - 10.5 K/uL   RBC 5.01  4.22 - 5.81 MIL/uL   Hemoglobin 16.5  13.0 - 17.0 g/dL   HCT 30.8  65.7 - 84.6 %   MCV 91.2  78.0 - 100.0 fL   MCH 32.9  26.0 - 34.0 pg   MCHC 36.1 (*) 30.0 - 36.0 g/dL   RDW 96.2  95.2 - 84.1 %   Platelets 214  150 - 400 K/uL   Neutrophils Relative % 69  43 - 77 %   Neutro Abs 5.2  1.7 - 7.7 K/uL   Lymphocytes Relative 24  12 - 46 %   Lymphs Abs 1.8  0.7 - 4.0 K/uL   Monocytes Relative 4  3 - 12 %   Monocytes Absolute 0.3  0.1 - 1.0 K/uL   Eosinophils Relative 2  0 - 5 %   Eosinophils Absolute 0.2  0.0 - 0.7 K/uL   Basophils Relative 0  0 - 1 %   Basophils Absolute 0.0  0.0 - 0.1 K/uL  COMPREHENSIVE METABOLIC PANEL     Status: Abnormal   Collection Time    07/20/12 11:03 AM      Result Value Range   Sodium 142  135 - 145 mEq/L   Potassium 4.3  3.5 - 5.1 mEq/L   Chloride 103  96 - 112 mEq/L   CO2 26  19 - 32 mEq/L   Glucose, Bld 183 (*) 70 - 99 mg/dL   BUN 12  6 - 23 mg/dL   Creatinine, Ser 4.09  0.50 - 1.35 mg/dL   Calcium 9.8  8.4 - 81.1 mg/dL   Total Protein 7.7  6.0 - 8.3 g/dL   Albumin 4.0  3.5 - 5.2 g/dL   AST 21  0 - 37 U/L   ALT 29  0 - 53 U/L   Alkaline Phosphatase 81  39 - 117 U/L   Total Bilirubin 0.5  0.3 - 1.2 mg/dL   GFR calc non Af Amer >90  >90 mL/min   GFR calc Af Amer >90  >90 mL/min   Comment:            The eGFR has been calculated     using the CKD EPI equation.     This calculation has not been     validated in all clinical     situations.     eGFR's persistently     <90 mL/min signify     possible Chronic Kidney Disease.  POCT I-STAT TROPONIN I     Status: None   Collection Time    07/09/12 11:25 AM      Result Value Range   Troponin i, poc 0.00  0.00 - 0.08 ng/mL   Comment 3            Comment: Due to the release kinetics of cTnI,     a negative result within the first hours     of the onset of symptoms does not rule out     myocardial  infarction with certainty.     If myocardial infarction is still suspected,     repeat the test at appropriate intervals.  GLUCOSE, CAPILLARY     Status: Abnormal   Collection Time    07/09/12  2:47 PM      Result Value Range   Glucose-Capillary 139 (*) 70 - 99 mg/dL   Comment 1 Notify RN    GLUCOSE, CAPILLARY     Status: Abnormal   Collection Time    07/09/12  4:22 PM      Result Value Range   Glucose-Capillary 173 (*) 70 - 99 mg/dL   Comment 1 Notify RN    TROPONIN I     Status: None   Collection Time    07/09/12  4:47 PM      Result Value Range   Troponin I <0.30  <0.30 ng/mL   Comment:            Due to the release kinetics of cTnI,     a negative result within the first hours     of the onset of symptoms does not rule out     myocardial infarction with certainty.     If myocardial infarction is still suspected,     repeat the test at appropriate intervals.    Dg Chest 2 View  07/09/2012   *RADIOLOGY REPORT*  Clinical Data: Chest pain  CHEST - 2 VIEW  Comparison: May 16, 2005  Findings:  Lungs clear.  Heart size and pulmonary vascularity are normal.  No adenopathy.  No pneumothorax.  No bone lesions.  IMPRESSION: No abnormality noted.   Original Report Authenticated By: Bretta Bang, M.D.    Review of Systems  Constitutional: Negative for fever, chills, weight loss and malaise/fatigue.  Eyes: Negative for blurred vision and double vision.  Respiratory: Negative for  cough, hemoptysis and sputum production.   Cardiovascular: Positive for chest pain. Negative for palpitations, orthopnea, claudication and leg swelling.  Gastrointestinal: Negative for heartburn, nausea, vomiting, abdominal pain and diarrhea.  Genitourinary: Negative for dysuria.  Neurological: Negative for dizziness and headaches.   Blood pressure 137/85, pulse 68, temperature 98.3 F (36.8 C), temperature source Oral, resp. rate 14, height 6\' 2"  (1.88 m), weight 121.791 kg (268 lb 8 oz), SpO2  98.00%. Physical Exam  Constitutional: He is oriented to person, place, and time. He appears well-developed and well-nourished.  HENT:  Head: Normocephalic and atraumatic.  Eyes: Conjunctivae and EOM are normal. Left eye exhibits no discharge. No scleral icterus.  Neck: Neck supple. No JVD present. No tracheal deviation present. No thyromegaly present.  Cardiovascular: Regular rhythm.  Exam reveals no gallop and no friction rub.   No murmur heard. Respiratory: Effort normal and breath sounds normal. No respiratory distress. He has no wheezes. He has no rales.  GI: Soft. Bowel sounds are normal. He exhibits no distension. There is no tenderness. There is no rebound and no guarding.  Musculoskeletal: He exhibits no edema.  Neurological: He is alert and oriented to person, place, and time.    Assessment/Plan: Atypical chest pain with some features worrisome for angina rule out MI Non-insulin-dependent diabetes mellitus Hypercholesteremia Positive family history of coronary artery disease Plan Agree with present management We'll schedule for a stress Myoview in a.m.  Robynn Pane 07/09/2012, 6:43 PM

## 2012-07-09 NOTE — Progress Notes (Signed)
[facility] [address] [place] [address] [id number]  Date:  07/09/2012   Name:  Ray Young   DOB:  1961-02-17   MRN:  [id number]  PCP:  No primary provider on file.    Chief Complaint: Hyperlipidemia, Chest Pain and Extremity Weakness   History of Present Illness:  Ray Young is a 52 y.o. very pleasant male patient who presents with the following:  Seen here in October for a CPE.  History of gout and obestiy.  He was noted to have diabetes at his CPE and was started on Metformin, as well as cholesterol medication for hyperlipidemia.  He was asked to follow- up in 2 months but we have not seen him back for follow- up since then.    Yesterday while at work he had "an episode of chest pain."  He had recently eaten and had been moving some heavy objects at work; he is a [employer] certified accountant.  The pain started after he finished moving about 20 heavy boxes. The pain lasted for 30- 60 minutes.   He still has chest discomfort now, "like a cramp," is located in the epigastric area and through to his back.  He did have sweating with the pain, but no nausea.  He "had never had chest pain like that before."   He did take an antacid which helped "a little bit."   He took asa 42 #2 yesterday- he has not taken any aspirin so far today.    He has never had CP in the past.  No known history of cardiac problems.   His father has had 2 MIs- one about 10 years ago and one recently.    His legs feel "weak," bilaterally.  He has noted this since the CP episode yesterday    He does not take viagra, cialis or levitra.   There are no active problems to display for this patient.   Past Medical History  Diagnosis Date  . Gout   . Arthritis     Past Surgical History  Procedure Laterality Date  . Reattachment of finger      left pinky finger 2003  . Fracture surgery      History  Substance Use Topics  . Smoking status: Never Smoker   . Smokeless tobacco: Not on  file  . Alcohol Use: No    Family History  Problem Relation Age of Onset  . Heart disease Father     No Known Allergies  Medication list has been reviewed and updated.  Current Outpatient Prescriptions on File Prior to Visit  Medication Sig Dispense Refill  . atorvastatin (LIPITOR) 40 MG tablet Take 1 tablet (40 mg total) by mouth at bedtime.  30 tablet  5  . Febuxostat (ULORIC) 80 MG TABS Take 1 tablet (80 mg total) by mouth daily.  30 tablet  11  . metFORMIN (GLUCOPHAGE) 1000 MG tablet Take 1 tablet (1,000 mg total) by mouth 2 (two) times daily with a meal.  180 tablet  2  . Multiple Vitamins-Minerals (CENTRUM SILVER ULTRA MENS PO) Take by mouth.       No current facility-administered medications on file prior to visit.    Review of Systems:  As per HPI- otherwise negative.   Physical Examination: Filed Vitals:   07/09/12 0922  BP: 120/90  Pulse: 89  Temp: 98.8 F (37.1 C)  Resp: 17   Filed Vitals:   07/09/12 0922  Weight: 273 lb (123.832 kg)  Body mass index is 36.03 kg/(m^2). Ideal Body Weight:    GEN: WDWN, NAD, Non-toxic, A & O x 3, obese HEENT: Atraumatic, Normocephalic. Neck supple. No masses, No LAD. Ears and Nose: No external deformity. CV: RRR, No M/G/R. No JVD. No thrill. No extra heart sounds. PULM: CTA B, no wheezes, crackles, rhonchi. No retractions. No resp. distress. No accessory muscle use. ABD: S, NT, ND. No rebound. No HSM.  No epigastric tenderness EXTR: No c/c/e NEURO Normal gait.  PSYCH: Normally interactive. Conversant. Not depressed or anxious appearing.  Calm demeanor.   EKG: wnl per machine read, but may have mild ST depression in V3/4/5/6.   Given asa 81#4, started oxygen [place] 2L.   Assessment and Plan: Chest pain - Plan: EKG 12-Lead, aspirin chewable tablet 324 mg  Episode of typical CP yesterday, now with persistent mild pain in a 52 year old male with history of DM and hyperlipidemia.  Non- smoker. EKG is reassuring but he has  risk factors for CAD.  Will transport to ED for further evaluation and CP rule- out.    Signed Abbe Amsterdam, MD

## 2012-07-10 ENCOUNTER — Observation Stay (HOSPITAL_COMMUNITY): Payer: Worker's Compensation

## 2012-07-10 ENCOUNTER — Encounter (HOSPITAL_COMMUNITY): Payer: Worker's Compensation

## 2012-07-10 LAB — GLUCOSE, CAPILLARY
Glucose-Capillary: 156 mg/dL — ABNORMAL HIGH (ref 70–99)
Glucose-Capillary: 147 mg/dL — ABNORMAL HIGH (ref 70–99)

## 2012-07-10 LAB — LIPID PANEL
HDL: 38 mg/dL — ABNORMAL LOW (ref >39)
Triglycerides: 141 mg/dL (ref <150)

## 2012-07-10 LAB — HEMOGLOBIN A1C: Hgb A1c MFr Bld: 8 % — ABNORMAL HIGH (ref <5.7)

## 2012-07-10 LAB — BASIC METABOLIC PANEL
CO2: 24 mEq/L (ref 19–32)
Chloride: 99 mEq/L (ref 96–112)
Creatinine, Ser: 0.8 mg/dL (ref 0.50–1.35)
Potassium: 4.4 mEq/L (ref 3.5–5.1)

## 2012-07-10 LAB — TSH: TSH: 1.513 u[IU]/mL (ref 0.350–4.500)

## 2012-07-10 MED ORDER — TECHNETIUM TC 99M SESTAMIBI GENERIC - CARDIOLITE
10.0000 | Freq: Once | INTRAVENOUS | Status: AC | PRN
  Administered 2012-07-10: 10 via INTRAVENOUS

## 2012-07-10 MED ORDER — ASPIRIN EC 81 MG PO TBEC: 81.0000 mg | DELAYED_RELEASE_TABLET | Freq: Every day | ORAL | Status: AC

## 2012-07-10 MED ORDER — ATORVASTATIN CALCIUM 80 MG PO TABS: 80.0000 mg | ORAL_TABLET | Freq: Every day | ORAL | Status: DC

## 2012-07-10 MED ORDER — TECHNETIUM TC 99M SESTAMIBI GENERIC - CARDIOLITE
30.0000 | Freq: Once | INTRAVENOUS | Status: AC | PRN
  Administered 2012-07-10: 30 via INTRAVENOUS

## 2012-07-10 MED ORDER — ATORVASTATIN CALCIUM 80 MG PO TABS
80.0000 mg | ORAL_TABLET | Freq: Every day | ORAL | Status: DC
  Filled 2012-07-10: qty 1

## 2012-07-10 NOTE — Progress Notes (Signed)
Subjective:  Patient denies any chest pain or shortness of breath. Patient tolerated the stress portion of the stress test okay. Nuclear scan results are still pending  Objective:  Vital Signs in the last 24 hours: Temp:  [98 F (36.7 C)-98.3 F (36.8 C)] 98.2 F (36.8 C) (05/21 0622) Pulse Rate:  [59-152] 94 (05/21 0952) Resp:  [11-14] 14 (05/20 1454) BP: (101-168)/(69-85) 143/78 mmHg (05/21 0952) SpO2:  [96 %-99 %] 96 % (05/21 0622) Weight:  [121.791 kg (268 lb 8 oz)] 121.791 kg (268 lb 8 oz) (05/20 1454)  Intake/Output from previous day:   Intake/Output from this shift:    Physical Exam: Neck: no adenopathy, no carotid bruit, no JVD and supple, symmetrical, trachea midline Lungs: clear to auscultation bilaterally Heart: regular rate and rhythm, S1, S2 normal, no murmur, click, rub or gallop Abdomen: soft, non-tender; bowel sounds normal; no masses,  no organomegaly Extremities: extremities normal, atraumatic, no cyanosis or edema  Lab Results:  Recent Labs  07/09/12 1103  WBC 7.5  HGB 16.5  PLT 214    Recent Labs  07/09/12 1103 07/10/12 0430  NA 142 136  K 4.3 4.4  CL 103 99  CO2 26 24  GLUCOSE 183* 163*  BUN 12 13  CREATININE 0.73 0.80    Recent Labs  07/09/12 1647 07/09/12 2323  TROPONINI <0.30 <0.30   Hepatic Function Panel  Recent Labs  07/09/12 1103  PROT 7.7  ALBUMIN 4.0  AST 21  ALT 29  ALKPHOS 81  BILITOT 0.5    Recent Labs  07/10/12 0430  CHOL 162   No results found for this basename: PROTIME,  in the last 72 hours  Imaging: Imaging results have been reviewed and Dg Chest 2 View  07/09/2012   *RADIOLOGY REPORT*  Clinical Data: Chest pain  CHEST - 2 VIEW  Comparison: May 16, 2005  Findings:  Lungs clear.  Heart size and pulmonary vascularity are normal.  No adenopathy.  No pneumothorax.  No bone lesions.  IMPRESSION: No abnormality noted.   Original Report Authenticated By: Bretta Bang, M.D.    Cardiac  Studies:  Assessment/Plan:  Atypical chest pain with some features worrisome for angina MI ruled out Non-insulin-dependent diabetes mellitus  Hypercholesteremia  Positive family history of coronary artery disease  Plan Continue present management Check nuclear stress test result if negative for ischemia okay to discharge from cardial point of view  LOS: 1 day    Marcellino Fidalgo N 07/10/2012, 12:02 PM

## 2012-07-10 NOTE — Progress Notes (Signed)
Pt provided with dc instructions and educatino. Pt verbalized understanding. NO questions at this time. IV removed with tip intact. Heart monitor cleaned and returned to front. Levonne Spiller, RN

## 2012-07-10 NOTE — Discharge Summary (Signed)
Physician Discharge Summary  Ray Young:[id number] DOB: 03-Mar-1960 DOA: 07/09/2012  PCP: No primary provider on file.  Admit date: 07/09/2012 Discharge date: 07/10/2012  Recommendations for Outpatient Follow-up:  1. Follow up with primary care doctor within 2 weeks for general exam and follow up of chest discomfort, likely muscle strain, and blood pressure/diabetes management.      Discharge Diagnoses:  Active Problems:   Hyperlipidemia   Chest pain   Discharge Condition: stable, improved  Diet recommendation: healthy heart  Wt Readings from Last 3 Encounters:  07/09/12 121.791 kg (268 lb 8 oz)  07/09/12 123.832 kg (273 lb)  12/03/11 128.368 kg (283 lb)    History of present illness:   Ray Young is a 52 y.o. very pleasant male patient who presents with the following:  Seen here in October for a CPE. History of gout and obestiy. He was noted to have diabetes at his CPE and was started on Metformin, as well as cholesterol medication for hyperlipidemia. He was asked to follow- up in 2 months but we have not seen him back for follow- up since then.  Yesterday while at work he had "an episode of chest pain." He had recently eaten and had been moving some heavy objects at work; he is a [employer] certified accountant. The pain started after he finished moving about 20 heavy boxes. The pain lasted for 30- 60 minutes. He still has chest discomfort now, "like a cramp," is located in the epigastric area and through to his back. He did have sweating with the pain, but no nausea. He "had never had chest pain like that before."  He did take an antacid which helped "a little bit."  He took asa 8 #2 yesterday- he has not taken any aspirin so far today.  He has never had CP in the past. No known history of cardiac problems.  His father has had 2 MIs- one about 10 years ago and one recently.  His legs feel "weak," bilaterally. He has noted this since the CP episode yesterday  He does not take viagra,  cialis or levitra.    Hospital Course:   Chest pain.  Ray Young was admitted with chest pain.  His telemetry demonstrated sinus rhythm with occasional PVC.  His troponins were negative.  His lipid panel demonstrated mildly depressed HDL 38 and LDL of 96.  His statin was increased to atorvastatin 80mg .  His A1c was also elevated.  He was started on a daily aspirin and beta blocker was held prior to stress test.  He was evaluated by Dr. Sharyn Lull from cardiology who recommended NM stress test which was negative.  He was advised to use heating packs and naprosyn as needed for chest pain.    Diabetes mellitus type 2.  A1c now 8, down from 8.9 11/2011.  His metformin was held and he was placed on sliding scale insulin while in the hospital.  His Framingham risk is 13% /10 years so will start on daily baby aspirin.    Hyperlipidemia.  LDL not at goal.  Increased atorvastatin.  TSH wnl.     Lab Results  Component Value Date   CHOL 162 07/10/2012   HDL 38* 07/10/2012   LDLCALC 96 07/10/2012   TRIG 141 07/10/2012   CHOLHDL 4.3 07/10/2012     Gouty arthritis, stable.  Continue uloric.    Procedures:  NM stress test negative  Consultations:  Dr. Sharyn Lull, Cardiology  Discharge Exam: Filed Vitals:   07/10/12 [id number]  BP: 143/78  Pulse: 94  Temp:   Resp:    Filed Vitals:   07/10/12 0946 07/10/12 0948 07/10/12 0950 07/10/12 0952  BP: 153/69 168/76 156/79 143/78  Pulse: 141 103 95 94  Temp:      TempSrc:      Resp:      Height:      Weight:      SpO2:       Feeling well.  Still has some xyphoid-area pain with movement.    General: [name], no acute distress HEENT:  NCAT, MMM Cardiovascular: RRR, no mrg, 2+ pulses, warm extremities Respiratory: CTAB, no increased WOB ABD:  NABS, soft, mild TTP over xyphoid and surrounding musculature without rebound or guarding.  Nondistended MSK:  NO LEE, normal tone and bulk NEURO:  Grossly intact  Discharge Instructions      Discharge  Orders   Future Orders Complete By Expires     Call MD for:  difficulty breathing, headache or visual disturbances  As directed     Call MD for:  extreme fatigue  As directed     Call MD for:  hives  As directed     Call MD for:  persistant dizziness or light-headedness  As directed     Call MD for:  persistant nausea and vomiting  As directed     Call MD for:  severe uncontrolled pain  As directed     Call MD for:  temperature >100.4  As directed     Diet - low sodium heart healthy  As directed     Diet Carb Modified  As directed     Discharge instructions  As directed     Comments:      You were hospitalized with chest pain.  You did not have a heart attack and your stress test was negative.  You likely have muscle-related pain.  Please do light lifting only (< 10-lbs and not over your head) for the next 2 weeks.  Please see your primary care doctor during this period and they may give you further instructions if you are still not feeling well.  You may take aleve or ibuprofen as needed and use heating packs for the pain.  You may take a daily baby aspirin 81mg  to protect against heart attack.  I have given you a prescription for higher dose atorvastatin to get your cholesterol in better control.  Please bring all your medications to your follow up appointment.    Increase activity slowly  As directed         Medication List    TAKE these medications       aspirin EC 81 MG tablet  Take 1 tablet (81 mg total) by mouth daily.     atorvastatin 80 MG tablet  Commonly known as:  LIPITOR  Take 1 tablet (80 mg total) by mouth at bedtime.     CENTRUM SILVER ULTRA MENS PO  Take 1 tablet by mouth daily.     Febuxostat 80 MG Tabs  Take 1 tablet by mouth daily.     ibuprofen 200 MG tablet  Commonly known as:  ADVIL,MOTRIN  Take 200 mg by mouth every 6 (six) hours as needed for pain.     metFORMIN 1000 MG tablet  Commonly known as:  GLUCOPHAGE  Take 500 mg by mouth 2 (two) times daily  with a meal.       Follow-up Information   Follow up with Robynn Pane, MD.  Schedule an appointment as soon as possible for a visit in 1 month. (As needed)    Contact information:   [address] [place] [address] [phone]       Follow up with Herb Grays, MD. Schedule an appointment as soon as possible for a visit in 2 weeks.   Contact information:   [address] [place] [address] [phone]        The results of significant diagnostics from this hospitalization (including imaging, microbiology, ancillary and laboratory) are listed below for reference.    Significant Diagnostic Studies: Dg Chest 2 View  07/09/2012   *RADIOLOGY REPORT*  Clinical Data: Chest pain  CHEST - 2 VIEW  Comparison: May 16, 2005  Findings:  Lungs clear.  Heart size and pulmonary vascularity are normal.  No adenopathy.  No pneumothorax.  No bone lesions.  IMPRESSION: No abnormality noted.   Original Report Authenticated By: Bretta Bang, M.D.    Microbiology: No results found for this or any previous visit (from the past 240 hour(s)).   Labs: Basic Metabolic Panel:  Recent Labs Lab 07/09/12 1103 07/10/12 0430  NA 142 136  K 4.3 4.4  CL 103 99  CO2 26 24  GLUCOSE 183* 163*  BUN 12 13  CREATININE 0.73 0.80  CALCIUM 9.8 9.4   Liver Function Tests:  Recent Labs Lab 07/09/12 1103  AST 21  ALT 29  ALKPHOS 81  BILITOT 0.5  PROT 7.7  ALBUMIN 4.0   No results found for this basename: LIPASE, AMYLASE,  in the last 168 hours No results found for this basename: AMMONIA,  in the last 168 hours CBC:  Recent Labs Lab 07/09/12 1103  WBC 7.5  NEUTROABS 5.2  HGB 16.5  HCT 45.7  MCV 91.2  PLT 214   Cardiac Enzymes:  Recent Labs Lab 07/09/12 1647 07/09/12 2323  TROPONINI <0.30 <0.30   BNP: BNP (last 3 results) No results found for this basename: PROBNP,  in the last 8760 hours CBG:  Recent Labs Lab  07/09/12 1447 07/09/12 1622 07/09/12 2204 07/10/12 0721 07/10/12 1256  GLUCAP 139* 173* 132* 156* 147*    Time coordinating discharge: 45 minutes  Signed:  Dellie Piasecki  [facility] 07/10/2012, 2:37 PM

## 2012-07-11 NOTE — ED Provider Notes (Signed)
I saw and evaluated the patient, reviewed the resident's note and I agree with the findings and plan.   .Face to face Exam:  General:  Awake HEENT:  Atraumatic Resp:  Normal effort Abd:  Nondistended Neuro:No focal weakness Lymph: No adenopathy  Nelia Shi, MD 07/11/12 1354

## 2012-07-15 NOTE — H&P (Signed)
[facility] History and Physical  Ray Young ZOX:[id number] DOB: [birth year] DOA: 07-20-12   PCP: No primary provider on file.   Chief Complaint: Chest pain  HPI:  52 year old male with a history of diabetes, hyperlipidemia, and gouty arthritis presents with one-day history of chest discomfort. The patient states that he had chest discomfort around 3 PM yesterday when he was lifting some heavy boxes at work. The patient subsequently associated this with some nausea and diaphoresis. He stated that the chest discomfort was of a "cramp" sensation. He rated it 7/10. Overnight, he stated that the pain persisted but was less. He went to his primary care physician today who urged him to come to the emergency department. In emergency department, initial troponin was negative and EKG was unremarkable. CBC was unremarkable. BMP was unremarkable as well as his LFTs. The patient is currently pain-free. Chest x-ray is negative. The patient's vital signs are stable. Because of his risk factors, admission was advised to rule out ACS. The patient has never smoked. His dad had a heart attack at age 8 and 80. Assessment/Plan: Chest pain -Currently chest pain-free -Serial troponins -Given the patient's risk factors and associated diaphoresis and nausea, consult cardiology for possible stress test -Check hemoglobin A1c, TSH, lipids -Aspirin daily -Place patient on observation on telemetry Diabetes mellitus type 2 -Discontinue metformin for now -Place on NovoLog sliding scale -12/03/2011 hemoglobin A1c 8.9--prior to starting on metformin Hyperlipidemia -Continue atorvastatin -Check lipid panel -Check TSH Gouty arthritis -Continue Uloric        Past Medical History  Diagnosis Date  . High cholesterol     "dx'd 11/2011" (2012/07/20)  . Kidney stone     "passed on it's own" (2012/07/20)  . Anginal pain     "last 2 days" (07-20-12)  . Gouty arthritis   . Type II diabetes mellitus     "dx'd 11/2011" (2012/07/20)   Past Surgical History  Procedure Laterality Date  . Replantation finger Left ~ 2003    "pinky; got mashed; had to pull bone out and reattach tip" (Jul 20, 2012)   Social History:  reports that he has never smoked. His smokeless tobacco use includes Chew. He reports that he does not drink alcohol or use illicit drugs.   Family History  Problem Relation Age of Onset  . Heart disease Father      No Known Allergies    Prior to Admission medications   Medication Sig Start Date End Date Taking? Authorizing Provider  Febuxostat 80 MG TABS Take 1 tablet by mouth daily. 12/03/11  Yes Thao P Le, DO  ibuprofen (ADVIL,MOTRIN) 200 MG tablet Take 200 mg by mouth every 6 (six) hours as needed for pain.   Yes Historical Provider, MD  metFORMIN (GLUCOPHAGE) 1000 MG tablet Take 500 mg by mouth 2 (two) times daily with a meal.   Yes Historical Provider, MD  atorvastatin (LIPITOR) 40 MG tablet Take 40 mg by mouth at bedtime. 12/26/11   Thao P Le, DO  Multiple Vitamins-Minerals (CENTRUM SILVER ULTRA MENS PO) Take 1 tablet by mouth daily.     Historical Provider, MD    Review of Systems:  Constitutional:  No weight loss, night sweats, Fevers, chills, fatigue.  Head&Eyes: No headache.  No vision loss.  No eye pain or scotoma ENT:  No Difficulty swallowing,Tooth/dental problems,Sore throat,  No ear ache, post nasal drip,  Cardio-vascular:  No Orthopnea, PND, swelling in lower extremities,  dizziness, palpitations  GI:  No  abdominal pain,vomiting, diarrhea, loss  of appetite, hematochezia, melena, heartburn, indigestion, Resp:  No shortness of breath with exertion or at rest. No cough. No coughing up of blood .No wheezing.No chest wall deformity  Skin:  no rash or lesions.  GU:  no dysuria, change in color of urine, no urgency or frequency. No flank pain.  Musculoskeletal:  No joint pain or swelling. No decreased range of motion. No back pain.  Psych:  No change in  mood or affect. No depression or anxiety. Neurologic: No headache, no dysesthesia, no focal weakness, no vision loss. No syncope  Physical Exam: Filed Vitals:   07/10/12 0946 07/10/12 0948 07/10/12 0950 07/10/12 0952  BP: 153/69 168/76 156/79 143/78  Pulse: 141 103 95 94  Temp:      TempSrc:      Resp:      Height:      Weight:      SpO2:       General:  A&O x 3, NAD, nontoxic, pleasant/cooperative Head/Eye: No conjunctival hemorrhage, no icterus, Carter/AT, No nystagmus ENT:  No icterus,  No thrush, good dentition, no pharyngeal exudate Neck:  No masses, no lymphadenpathy, no bruits CV:  RRR, no rub, no gallop, no S3 Lung:  CTAB, good air movement, no wheeze, no rhonchi Abdomen: soft/NT, +BS, nondistended, no peritoneal signs Ext: No cyanosis, No rashes, No petechiae, No lymphangitis, No edema   Labs on Admission:  Basic Metabolic Panel:  Recent Labs Lab 07/09/12 1103 07/10/12 0430  NA 142 136  K 4.3 4.4  CL 103 99  CO2 26 24  GLUCOSE 183* 163*  BUN 12 13  CREATININE 0.73 0.80  CALCIUM 9.8 9.4   Liver Function Tests:  Recent Labs Lab 07/09/12 1103  AST 21  ALT 29  ALKPHOS 81  BILITOT 0.5  PROT 7.7  ALBUMIN 4.0   No results found for this basename: LIPASE, AMYLASE,  in the last 168 hours No results found for this basename: AMMONIA,  in the last 168 hours CBC:  Recent Labs Lab 07/09/12 1103  WBC 7.5  NEUTROABS 5.2  HGB 16.5  HCT 45.7  MCV 91.2  PLT 214   Cardiac Enzymes:  Recent Labs Lab 07/09/12 1647 07/09/12 2323  TROPONINI <0.30 <0.30   BNP: No components found with this basename: POCBNP,  CBG:  Recent Labs Lab 07/09/12 1447 07/09/12 1622 07/09/12 2204 07/10/12 0721 07/10/12 1256  GLUCAP 139* 173* 132* 156* 147*    Radiological Exams on Admission: No results found.  EKG: Independently reviewed. Sinus rhythm, no ST-T wave change    Time spent:60 minutes Code Status:   FULL Family Communication:   wife at bedside   Shannel Zahm,  Shaheim Mahar, DO  [facility] Pager [phone]  If 7PM-7AM, please contact night-coverage [email or web address] Password [facility] 07/15/2012, 3:38 PM

## 2012-09-05 ENCOUNTER — Encounter: Payer: Self-pay | Admitting: Diagnostic Neuroimaging

## 2012-09-05 ENCOUNTER — Ambulatory Visit (INDEPENDENT_AMBULATORY_CARE_PROVIDER_SITE_OTHER): Payer: BLUE CROSS/BLUE SHIELD | Admitting: Diagnostic Neuroimaging

## 2012-09-05 VITALS — BP 138/91 | HR 71 | Temp 98.6°F | Ht 73.5 in | Wt 269.0 lb

## 2012-09-05 DIAGNOSIS — G373 Acute transverse myelitis in demyelinating disease of central nervous system: Secondary | ICD-10-CM

## 2012-09-05 DIAGNOSIS — G0489 Other myelitis: Secondary | ICD-10-CM

## 2012-09-05 NOTE — Patient Instructions (Signed)
I will check add'l testing. 

## 2012-09-05 NOTE — Progress Notes (Signed)
[facility]  PATIENT: Ray Young DOB: 1961/01/03  REFERRING CLINICIAN: T Spear HISTORY FROM: patient  REASON FOR VISIT: new consult   HISTORICAL  CHIEF COMPLAINT:  Chief Complaint  Patient presents with  . Back Pain    HISTORY OF PRESENT ILLNESS:   52 year old right-handed male with diabetes and hypercholesterolemia, daily smokeless tobacco use, here for evaluation of abnormal MRI thoracic spine, possible transverse myelitis.  May 2014 patient was at work lifting boxes, when all of a sudden he felt pain in his anterior epigastric region with a pulling sensation circumferentially around both sides. Initial pain lasted for 15 minutes. This progressed to a numbness from his chest down to his feet over the next several hours. Numbness from his anterior chest down to his feet lasted for 2 weeks. He also noted some trouble with urination, with hesitancy and dribbling and incomplete voiding for the next one and half weeks. No weakness of his lower extremities. Patient went to the emergency room and was admitted for cardiac workup and discharged. On followup with PCP, spinal cord etiologies were pursued.  Currently patient has a "sunburn sensation" on his buttocks and proximal legs. This is a new finding.  In retrospect no prodromal viral infection, vaccination, illness, change medication or activity. No prior similar symptoms. No prior focal neurologic symptoms such as unilateral numbness, slurred speech, unilateral visual change or eye pain. No headaches.  He has some intermittent low back pain radiating to his right leg. This pain has been present from before.  REVIEW OF SYSTEMS: Full 14 system review of systems performed and notable only for numbness, snoring, restless legs.  ALLERGIES: No Known Allergies  HOME MEDICATIONS: Outpatient Prescriptions Prior to Visit  Medication Sig Dispense Refill  . aspirin EC 81 MG tablet Take 1 tablet (81 mg total) by  mouth daily.  30 tablet  0  . atorvastatin (LIPITOR) 80 MG tablet Take 1 tablet (80 mg total) by mouth at bedtime.  30 tablet  1  . Febuxostat 80 MG TABS Take 1 tablet by mouth daily.      Marland Kitchen ibuprofen (ADVIL,MOTRIN) 200 MG tablet Take 200 mg by mouth every 6 (six) hours as needed for pain.      . metFORMIN (GLUCOPHAGE) 1000 MG tablet Take 500 mg by mouth 2 (two) times daily with a meal.      . Multiple Vitamins-Minerals (CENTRUM SILVER ULTRA MENS PO) Take 1 tablet by mouth daily.        No facility-administered medications prior to visit.    PAST MEDICAL HISTORY: Past Medical History  Diagnosis Date  . High cholesterol     "dx'd 11/2011" (2012/07/17)  . Kidney stone     "passed on it's own" (07-17-2012)  . Anginal pain     "last 2 days" (2012/07/17)  . Gouty arthritis   . Type II diabetes mellitus     "dx'd 11/2011" (07/17/2012)    PAST SURGICAL HISTORY: Past Surgical History  Procedure Laterality Date  . Replantation finger Left ~ 2003    "pinky; got mashed; had to pull bone out and reattach tip" (07-17-2012)    FAMILY HISTORY: Family History  Problem Relation Age of Onset  . Heart disease Father     SOCIAL HISTORY:  History   Social History  . Marital Status: Single    Spouse Name: N/A    Number of Children: N/A  . Years of Education: N/A   Occupational History  . Not on file.  Social History Main Topics  . Smoking status: Never Smoker   . Smokeless tobacco: Current User    Types: Chew  . Alcohol Use: No  . Drug Use: No  . Sexually Active: Yes    Birth Control/ Protection: None   Other Topics Concern  . Not on file   Social History Narrative  . No narrative on file     PHYSICAL EXAM  Filed Vitals:   09/05/12 1408  BP: 138/91  Pulse: 71  Temp: 98.6 F (37 C)  TempSrc: Oral  Height: 6' 1.5" (1.867 m)  Weight: 269 lb (122.018 kg)    Not recorded    Body mass index is 35.01 kg/(m^2).  GENERAL EXAM: Patient is in no  distress  CARDIOVASCULAR: Regular rate and rhythm, no murmurs, no carotid bruits  NEUROLOGIC: MENTAL STATUS: awake, alert, language fluent, comprehension intact, naming intact CRANIAL NERVE: no papilledema on fundoscopic exam, pupils equal and reactive to light (BUT LEFT PUPIL SLIGHTLY SLUGGISH TO DIRECT LIGHT), visual fields full to confrontation, extraocular muscles intact, no nystagmus, facial sensation and strength symmetric, uvula midline, shoulder shrug symmetric, tongue midline. MOTOR: normal bulk and tone, full strength in the BUE, BLE SENSORY: normal and symmetric to light touch, pinprick, temperature, vibration and proprioception; no spinal cord level to PP COORDINATION: finger-nose-finger, fine finger movements normal REFLEXES: deep tendon reflexes present and symmetric; down going toes GAIT/STATION: narrow based gait; able to walk on toes, heels and tandem; romberg is negative   DIAGNOSTIC DATA (LABS, IMAGING, TESTING) - I reviewed patient records, labs, notes, testing and imaging myself where available.  Lab Results  Component Value Date   WBC 7.5 07/09/2012   HGB 16.5 07/09/2012   HCT 45.7 07/09/2012   MCV 91.2 07/09/2012   PLT 214 07/09/2012      Component Value Date/Time   NA 136 07/10/2012 0430   K 4.4 07/10/2012 0430   CL 99 07/10/2012 0430   CO2 24 07/10/2012 0430   GLUCOSE 163* 07/10/2012 0430   BUN 13 07/10/2012 0430   CREATININE 0.80 07/10/2012 0430   CREATININE 0.73 12/03/2011 1157   CALCIUM 9.4 07/10/2012 0430   PROT 7.7 07/09/2012 1103   ALBUMIN 4.0 07/09/2012 1103   AST 21 07/09/2012 1103   ALT 29 07/09/2012 1103   ALKPHOS 81 07/09/2012 1103   BILITOT 0.5 07/09/2012 1103   GFRNONAA >90 07/10/2012 0430   GFRAA >90 07/10/2012 0430   Lab Results  Component Value Date   CHOL 162 07/10/2012   HDL 38* 07/10/2012   LDLCALC 96 07/10/2012   TRIG 141 07/10/2012   CHOLHDL 4.3 07/10/2012   Lab Results  Component Value Date   HGBA1C 8.0* 07/09/2012   No results found for  this basename: VITAMINB12   Lab Results  Component Value Date   TSH 1.513 07/09/2012    MRI brain - normal  MRI thoracic spine - thin, T2 hyperintense spinal cord lesion (mainly T5-T6 on axial views; T4-T6 on sag views); could be autoimmune, inflamm, post-infx, syringomyelia or central canal enlargement; no abnl enhancement  MRI lumbar spine - DDD, mod biforaminal foraminal stenosis at L3-4, L4-5; severe biforaminal foraminal stenosis at L5-S1   ASSESSMENT AND PLAN  52 y.o. year old male  has a past medical history of High cholesterol; Kidney stone; Anginal pain; Gouty arthritis; and Type II diabetes mellitus. here with suspected transverse myelitis, with symptoms almost fully resolved. Could be idiopathic transverse myelitis vs. clinically isolated syndrome of multiple sclerosis. Will check addl  testing.  PLAN: 1. VEP, MRI c-spine 2. Then possible spinal tap and labs    Orders Placed This Encounter  Procedures  . MR Cervical Spine W Wo Contrast  . Visual evoked potential test     No orders of the defined types were placed in this encounter.     Suanne Marker, MD 09/05/2012, 3:05 PM Certified in Neurology, Neurophysiology and Neuroimaging  [facility] [address], [address] [place], [place] [address] [phone]

## 2012-09-11 ENCOUNTER — Ambulatory Visit (INDEPENDENT_AMBULATORY_CARE_PROVIDER_SITE_OTHER): Payer: BLUE CROSS/BLUE SHIELD

## 2012-09-11 DIAGNOSIS — G0489 Other myelitis: Secondary | ICD-10-CM

## 2012-09-11 DIAGNOSIS — G373 Acute transverse myelitis in demyelinating disease of central nervous system: Secondary | ICD-10-CM

## 2012-09-16 NOTE — Procedures (Signed)
   [facility]  VEP (VISUAL EVOKED POTENTIAL) REPORT   STUDY DATE: 09/05/12 PATIENT NAME: Ray Young DOB: [birth year] MRN: [id number]  ORDERING CLINICIAN: Joycelyn Schmid, MD   TECHNOLOGIST: Gearldine Shown TECHNIQUE: The visual evoked potential test was performed using 32 x 32 check sizes with full pattern reversal. 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 Evaluate for optic neuritis (demyelinating disease screening).  FINDINGS: The visual acuity was 20/30 OD and 20/30 OS.  There are well formed evoked potential wave forms bilaterally.   P100 latency with right eye stimulation: 98 ms.   P100 latency with left eye stimulation: 97 ms.  The amplitudes for the P100 waveforms were also within normal limits bilaterally.   IMPRESSION: This visual evoked potential study is normal. No definite evidence of conduction problems of the visual pathways at this time.   INTERPRETING PHYSICIAN:  Suanne Marker, MD Certified in Neurology, Neurophysiology and Neuroimaging  [facility] [address] [place], [place] [address] [phone]

## 2012-09-18 ENCOUNTER — Ambulatory Visit (INDEPENDENT_AMBULATORY_CARE_PROVIDER_SITE_OTHER): Payer: BLUE CROSS/BLUE SHIELD

## 2012-09-18 DIAGNOSIS — G0489 Other myelitis: Secondary | ICD-10-CM

## 2012-09-18 DIAGNOSIS — G373 Acute transverse myelitis in demyelinating disease of central nervous system: Secondary | ICD-10-CM

## 2012-09-19 MED ORDER — GADOPENTETATE DIMEGLUMINE 469.01 MG/ML IV SOLN: 20.0000 mL | Freq: Once | INTRAVENOUS | Status: AC | PRN

## 2012-12-08 ENCOUNTER — Ambulatory Visit (INDEPENDENT_AMBULATORY_CARE_PROVIDER_SITE_OTHER): Payer: BLUE CROSS/BLUE SHIELD | Admitting: Emergency Medicine

## 2012-12-08 VITALS — BP 128/84 | HR 70 | Temp 99.0°F | Resp 16 | Ht 74.0 in | Wt 255.0 lb

## 2012-12-08 DIAGNOSIS — M109 Gout, unspecified: Secondary | ICD-10-CM

## 2012-12-08 DIAGNOSIS — E785 Hyperlipidemia, unspecified: Secondary | ICD-10-CM

## 2012-12-08 DIAGNOSIS — E119 Type 2 diabetes mellitus without complications: Secondary | ICD-10-CM

## 2012-12-08 DIAGNOSIS — Z0000 Encounter for general adult medical examination without abnormal findings: Secondary | ICD-10-CM

## 2012-12-08 DIAGNOSIS — E782 Mixed hyperlipidemia: Secondary | ICD-10-CM

## 2012-12-08 LAB — LIPID PANEL
Cholesterol: 204 mg/dL — ABNORMAL HIGH (ref 0–200)
Total CHOL/HDL Ratio: 4.3 Ratio
Triglycerides: 117 mg/dL (ref <150)
VLDL: 23 mg/dL (ref 0–40)

## 2012-12-08 LAB — POCT URINALYSIS DIPSTICK
Bilirubin, UA: NEGATIVE
Blood, UA: NEGATIVE
Glucose, UA: NEGATIVE
Ketones, UA: NEGATIVE
Leukocytes, UA: NEGATIVE
Nitrite, UA: NEGATIVE
Protein, UA: NEGATIVE
Spec Grav, UA: 1.025
Urobilinogen, UA: 0.2
pH, UA: 5

## 2012-12-08 LAB — PSA: PSA: 0.55 ng/mL (ref <=4.00)

## 2012-12-08 LAB — COMPREHENSIVE METABOLIC PANEL
ALT: 21 U/L (ref 0–53)
AST: 18 U/L (ref 0–37)
Albumin: 4.6 g/dL (ref 3.5–5.2)
Alkaline Phosphatase: 66 U/L (ref 39–117)
BUN: 14 mg/dL (ref 6–23)
CO2: 29 mEq/L (ref 19–32)
Calcium: 9.7 mg/dL (ref 8.4–10.5)
Chloride: 104 mEq/L (ref 96–112)
Creat: 0.86 mg/dL (ref 0.50–1.35)
Glucose, Bld: 139 mg/dL — ABNORMAL HIGH (ref 70–99)
Potassium: 4.8 mEq/L (ref 3.5–5.3)
Sodium: 138 mEq/L (ref 135–145)
Total Bilirubin: 0.7 mg/dL (ref 0.3–1.2)
Total Protein: 7.6 g/dL (ref 6.0–8.3)

## 2012-12-08 LAB — POCT CBC
HCT, POC: 47.4 % (ref 43.5–53.7)
Lymph, poc: 2.3 (ref 0.6–3.4)
MCH, POC: 31.7 pg — AB (ref 27–31.2)
MCHC: 31.9 g/dL (ref 31.8–35.4)
MCV: 99.4 fL — AB (ref 80–97)
POC Granulocyte: 4.2 (ref 2–6.9)
POC LYMPH PERCENT: 33.1 %L (ref 10–50)
RDW, POC: 13.2 %
WBC: 7 10*3/uL (ref 4.6–10.2)

## 2012-12-08 NOTE — Progress Notes (Signed)
[facility] [address], [place] [address] [phone]  Date:  12/08/2012   Name:  Ray Young   DOB:  Feb 10, 1961   MRN:  [id number]  PCP:  Herb Grays, MD    Chief Complaint: Annual Exam   History of Present Illness:  Ray Young is a 52 y.o. very pleasant male patient who presents with the following:  Has history of NIDDM and elevated cholesterol.  For repeat labs and refills.  Non smoker.  Tolerating medications with no adverse effect. No hypoglycemia events.  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  Fasting.  No improvement with over the counter medications or other home remedies. Denies other complaint or health concern today.   Patient Active Problem List   Diagnosis Date Noted  . Transverse myelitis 09/05/2012  . Obesity, unspecified Jul 20, 2012  . Type II or unspecified type diabetes mellitus without mention of complication, not stated as uncontrolled 07/20/12  . Other and unspecified hyperlipidemia 2012/07/20  . Hyperlipidemia 07/20/2012  . Chest pain 07-20-2012    Past Medical History  Diagnosis Date  . High cholesterol     "dx'd 11/2011" (Jul 20, 2012)  . Kidney stone     "passed on it's own" (07-20-12)  . Anginal pain     "last 2 days" (Jul 20, 2012)  . Gouty arthritis   . Type II diabetes mellitus     "dx'd 11/2011" (07-20-12)    Past Surgical History  Procedure Laterality Date  . Replantation finger Left ~ 2003    "pinky; got mashed; had to pull bone out and reattach tip" (Jul 20, 2012)    History  Substance Use Topics  . Smoking status: Never Smoker   . Smokeless tobacco: Current User    Types: Chew  . Alcohol Use: No    Family History  Problem Relation Age of Onset  . Heart disease Father     No Known Allergies  Medication list has been reviewed and updated.  Current Outpatient Prescriptions on File Prior to Visit  Medication Sig Dispense Refill  . aspirin EC 81 MG tablet Take 1 tablet (81 mg total) by mouth daily.   30 tablet  0  . Febuxostat 80 MG TABS Take 1 tablet by mouth daily.      Marland Kitchen ibuprofen (ADVIL,MOTRIN) 200 MG tablet Take 200 mg by mouth every 6 (six) hours as needed for pain.      . metFORMIN (GLUCOPHAGE) 1000 MG tablet Take 500 mg by mouth 2 (two) times daily with a meal.      . Multiple Vitamins-Minerals (CENTRUM SILVER ULTRA MENS PO) Take 1 tablet by mouth daily.       Marland Kitchen atorvastatin (LIPITOR) 80 MG tablet Take 1 tablet (80 mg total) by mouth at bedtime.  30 tablet  1   No current facility-administered medications on file prior to visit.    Review of Systems:  As per HPI, otherwise negative.    Physical Examination: Filed Vitals:   12/08/12 0912  BP: 128/84  Pulse: 70  Temp: 99 F (37.2 C)  Resp: 16   Filed Vitals:   12/08/12 0912  Height: 6\' 2"  (1.88 m)  Weight: 255 lb (115.667 kg)   Body mass index is 32.73 kg/(m^2). Ideal Body Weight: Weight in (lb) to have BMI = 25: 194.3  GEN: WDWN, NAD, Non-toxic, A & O x 3 HEENT: Atraumatic, Normocephalic. Neck supple. No masses, No LAD. Ears and Nose: No external deformity. CV: RRR, No M/G/R. No JVD. No thrill. No  extra heart sounds. PULM: CTA B, no wheezes, crackles, rhonchi. No retractions. No resp. distress. No accessory muscle use. ABD: S, NT, ND, +BS. No rebound. No HSM. EXTR: No c/c/e NEURO Normal gait.  PSYCH: Normally interactive. Conversant. Not depressed or anxious appearing.  Calm demeanor.    Assessment and Plan: Hyperlipidemia NIDDM  Signed,  Phillips Odor, MD

## 2012-12-08 NOTE — Patient Instructions (Signed)
Type 2 Diabetes Mellitus, Adult Type 2 diabetes mellitus, often simply referred to as type 2 diabetes, is a long-lasting (chronic) disease. In type 2 diabetes, the pancreas does not make enough insulin (a hormone), the cells are less responsive to the insulin that is made (insulin resistance), or both. Normally, insulin moves sugars from food into the tissue cells. The tissue cells use the sugars for energy. The lack of insulin or the lack of normal response to insulin causes excess sugars to build up in the blood instead of going into the tissue cells. As a result, high blood sugar (hyperglycemia) develops. The effect of high sugar (glucose) levels can cause many complications. Type 2 diabetes was also previously called adult-onset diabetes but it can occur at any age.  RISK FACTORS  A person is predisposed to developing type 2 diabetes if someone in the family has the disease and also has one or more of the following primary risk factors:  Overweight.  An inactive lifestyle.  A history of consistently eating high-calorie foods. Maintaining a normal weight and regular physical activity can reduce the chance of developing type 2 diabetes. SYMPTOMS  A person with type 2 diabetes may not show symptoms initially. The symptoms of type 2 diabetes appear slowly. The symptoms include:  Increased thirst (polydipsia).  Increased urination (polyuria).  Increased urination during the night (nocturia).  Weight loss. This weight loss may be rapid.  Frequent, recurring infections.  Tiredness (fatigue).  Weakness.  Vision changes, such as blurred vision.  Fruity smell to your breath.  Abdominal pain.  Nausea or vomiting.  Cuts or bruises which are slow to heal.  Tingling or numbness in the hands or feet. DIAGNOSIS Type 2 diabetes is frequently not diagnosed until complications of diabetes are present. Type 2 diabetes is diagnosed when symptoms or complications are present and when blood  glucose levels are increased. Your blood glucose level may be checked by one or more of the following blood tests:  A fasting blood glucose test. You will not be allowed to eat for at least 8 hours before a blood sample is taken.  A random blood glucose test. Your blood glucose is checked at any time of the day regardless of when you ate.  A hemoglobin A1c blood glucose test. A hemoglobin A1c test provides information about blood glucose control over the previous 3 months.  An oral glucose tolerance test (OGTT). Your blood glucose is measured after you have not eaten (fasted) for 2 hours and then after you drink a glucose-containing beverage. TREATMENT   You may need to take insulin or diabetes medicine daily to keep blood glucose levels in the desired range.  You will need to match insulin dosing with exercise and healthy food choices. The treatment goal is to maintain the before meal blood sugar (preprandial glucose) level at 70 130 mg/dL. HOME CARE INSTRUCTIONS   Have your hemoglobin A1c level checked twice a year.  Perform daily blood glucose monitoring as directed by your caregiver.  Monitor urine ketones when you are ill and as directed by your caregiver.  Take your diabetes medicine or insulin as directed by your caregiver to maintain your blood glucose levels in the desired range.  Never run out of diabetes medicine or insulin. It is needed every day.  Adjust insulin based on your intake of carbohydrates. Carbohydrates can raise blood glucose levels but need to be included in your diet. Carbohydrates provide vitamins, minerals, and fiber which are an essential part of   a healthy diet. Carbohydrates are found in fruits, vegetables, whole grains, dairy products, legumes, and foods containing added sugars.    Eat healthy foods. Alternate 3 meals with 3 snacks.  Lose weight if overweight.  Carry a medical alert card or wear your medical alert jewelry.  Carry a 15 gram  carbohydrate snack with you at all times to treat low blood glucose (hypoglycemia). Some examples of 15 gram carbohydrate snacks include:  Glucose tablets, 3 or 4   Glucose gel, 15 gram tube  Raisins, 2 tablespoons (24 grams)  Jelly beans, 6  Animal crackers, 8  Regular pop, 4 ounces (120 mL)  Gummy treats, 9  Recognize hypoglycemia. Hypoglycemia occurs with blood glucose levels of 70 mg/dL and below. The risk for hypoglycemia increases when fasting or skipping meals, during or after intense exercise, and during sleep. Hypoglycemia symptoms can include:  Tremors or shakes.  Decreased ability to concentrate.  Sweating.  Increased heart rate.  Headache.  Dry mouth.  Hunger.  Irritability.  Anxiety.  Restless sleep.  Altered speech or coordination.  Confusion.  Treat hypoglycemia promptly. If you are alert and able to safely swallow, follow the 15:15 rule:  Take 15 20 grams of rapid-acting glucose or carbohydrate. Rapid-acting options include glucose gel, glucose tablets, or 4 ounces (120 mL) of fruit juice, regular soda, or low fat milk.  Check your blood glucose level 15 minutes after taking the glucose.  Take 15 20 grams more of glucose if the repeat blood glucose level is still 70 mg/dL or below.  Eat a meal or snack within 1 hour once blood glucose levels return to normal.    Be alert to polyuria and polydipsia which are early signs of hyperglycemia. An early awareness of hyperglycemia allows for prompt treatment. Treat hyperglycemia as directed by your caregiver.  Engage in at least 150 minutes of moderate-intensity physical activity a week, spread over at least 3 days of the week or as directed by your caregiver. In addition, you should engage in resistance exercise at least 2 times a week or as directed by your caregiver.  Adjust your medicine and food intake as needed if you start a new exercise or sport.  Follow your sick day plan at any time you  are unable to eat or drink as usual.  Avoid tobacco use.  Limit alcohol intake to no more than 1 drink per day for nonpregnant women and 2 drinks per day for men. You should drink alcohol only when you are also eating food. Talk with your caregiver whether alcohol is safe for you. Tell your caregiver if you drink alcohol several times a week.  Follow up with your caregiver regularly.  Schedule an eye exam soon after the diagnosis of type 2 diabetes and then annually.  Perform daily skin and foot care. Examine your skin and feet daily for cuts, bruises, redness, nail problems, bleeding, blisters, or sores. A foot exam by a caregiver should be done annually.  Brush your teeth and gums at least twice a day and floss at least once a day. Follow up with your dentist regularly.  Share your diabetes management plan with your workplace or school.  Stay up-to-date with immunizations.  Learn to manage stress.  Obtain ongoing diabetes education and support as needed.  Participate in, or seek rehabilitation as needed to maintain or improve independence and quality of life. Request a physical or occupational therapy referral if you are having foot or hand numbness or difficulties with grooming,   dressing, eating, or physical activity. SEEK MEDICAL CARE IF:   You are unable to eat food or drink fluids for more than 6 hours.  You have nausea and vomiting for more than 6 hours.  Your blood glucose level is over 240 mg/dL.  There is a change in mental status.  You develop an additional serious illness.  You have diarrhea for more than 6 hours.  You have been sick or have had a fever for a couple of days and are not getting better.  You have pain during any physical activity.  SEEK IMMEDIATE MEDICAL CARE IF:  You have difficulty breathing.  You have moderate to large ketone levels. MAKE SURE YOU:  Understand these instructions.  Will watch your condition.  Will get help right away if  you are not doing well or get worse. Document Released: 02/06/2005 Document Revised: 11/01/2011 Document Reviewed: 09/05/2011 Exit[employer] Patient Information 2014 [employer].  

## 2012-12-09 MED ORDER — ATORVASTATIN CALCIUM 80 MG PO TABS: 80.0000 mg | ORAL_TABLET | Freq: Every day | ORAL | Status: AC

## 2012-12-09 MED ORDER — METFORMIN HCL 1000 MG PO TABS: 1000.0000 mg | ORAL_TABLET | Freq: Two times a day (BID) | ORAL | Status: AC

## 2012-12-09 MED ORDER — FEBUXOSTAT 80 MG PO TABS: 1.0000 | ORAL_TABLET | Freq: Every day | ORAL | Status: AC

## 2012-12-09 NOTE — Progress Notes (Signed)
Done

## 2012-12-09 NOTE — Addendum Note (Signed)
Addended by: Carmelina Dane on: 12/09/2012 09:52 AM   Modules accepted: Orders

## 2012-12-12 ENCOUNTER — Ambulatory Visit: Payer: BLUE CROSS/BLUE SHIELD | Admitting: Diagnostic Neuroimaging

## 2012-12-26 ENCOUNTER — Other Ambulatory Visit: Payer: Self-pay

## 2014-06-17 ENCOUNTER — Ambulatory Visit (HOSPITAL_COMMUNITY): Payer: Self-pay | Admitting: UROLOGY

## 2018-03-04 ENCOUNTER — Ambulatory Visit (HOSPITAL_COMMUNITY): Payer: Self-pay | Admitting: Emergency Medicine

## 2022-02-08 ENCOUNTER — Emergency Department
Admission: EM | Admit: 2022-02-08 | Discharge: 2022-02-08 | Disposition: A | Discharge status: Other Acute Inpatient Hospital | Payer: BC Managed Care – PPO | Attending: Emergency Medicine | Admitting: Emergency Medicine

## 2022-02-08 ENCOUNTER — Encounter (HOSPITAL_COMMUNITY): Payer: Self-pay

## 2022-02-08 ENCOUNTER — Emergency Department (HOSPITAL_COMMUNITY): Payer: BC Managed Care – PPO | Admitting: Anesthesiology

## 2022-02-08 ENCOUNTER — Emergency Department (HOSPITAL_COMMUNITY): Payer: BC Managed Care – PPO

## 2022-02-08 ENCOUNTER — Other Ambulatory Visit: Payer: Self-pay

## 2022-02-08 ENCOUNTER — Inpatient Hospital Stay (HOSPITAL_COMMUNITY): Payer: BC Managed Care – PPO | Admitting: Internal Medicine

## 2022-02-08 ENCOUNTER — Encounter (HOSPITAL_COMMUNITY): Payer: Self-pay | Admitting: Emergency Medicine

## 2022-02-08 ENCOUNTER — Encounter (HOSPITAL_COMMUNITY)
Admission: EM | Disposition: A | Discharge status: Home Health | Payer: Self-pay | Source: Other Acute Inpatient Hospital | Attending: Internal Medicine

## 2022-02-08 ENCOUNTER — Inpatient Hospital Stay
Admission: EM | Admit: 2022-02-08 | Discharge: 2022-02-15 | DRG: 660 | Disposition: A | Discharge status: Home / Self Care | Payer: BC Managed Care – PPO | Source: Other Acute Inpatient Hospital | Attending: Student in an Organized Health Care Education/Training Program | Admitting: Student in an Organized Health Care Education/Training Program

## 2022-02-08 DIAGNOSIS — E872 Acidosis, unspecified: Secondary | ICD-10-CM | POA: Diagnosis present

## 2022-02-08 DIAGNOSIS — I10 Essential (primary) hypertension: Secondary | ICD-10-CM | POA: Diagnosis present

## 2022-02-08 DIAGNOSIS — E871 Hypo-osmolality and hyponatremia: Secondary | ICD-10-CM | POA: Diagnosis present

## 2022-02-08 DIAGNOSIS — Z7984 Long term (current) use of oral hypoglycemic drugs: Secondary | ICD-10-CM

## 2022-02-08 DIAGNOSIS — E119 Type 2 diabetes mellitus without complications: Secondary | ICD-10-CM

## 2022-02-08 DIAGNOSIS — Z20822 Contact with and (suspected) exposure to covid-19: Secondary | ICD-10-CM | POA: Insufficient documentation

## 2022-02-08 DIAGNOSIS — N179 Acute kidney failure, unspecified: Secondary | ICD-10-CM

## 2022-02-08 DIAGNOSIS — Z87891 Personal history of nicotine dependence: Secondary | ICD-10-CM

## 2022-02-08 DIAGNOSIS — N132 Hydronephrosis with renal and ureteral calculous obstruction: Principal | ICD-10-CM | POA: Diagnosis present

## 2022-02-08 DIAGNOSIS — Z87442 Personal history of urinary calculi: Secondary | ICD-10-CM

## 2022-02-08 DIAGNOSIS — N281 Cyst of kidney, acquired: Secondary | ICD-10-CM | POA: Diagnosis present

## 2022-02-08 DIAGNOSIS — K5900 Constipation, unspecified: Secondary | ICD-10-CM | POA: Diagnosis not present

## 2022-02-08 DIAGNOSIS — E785 Hyperlipidemia, unspecified: Secondary | ICD-10-CM | POA: Diagnosis present

## 2022-02-08 DIAGNOSIS — N200 Calculus of kidney: Secondary | ICD-10-CM

## 2022-02-08 DIAGNOSIS — R34 Anuria and oliguria: Secondary | ICD-10-CM

## 2022-02-08 DIAGNOSIS — R000 Tachycardia, unspecified: Secondary | ICD-10-CM

## 2022-02-08 DIAGNOSIS — Z79899 Other long term (current) drug therapy: Secondary | ICD-10-CM

## 2022-02-08 DIAGNOSIS — E875 Hyperkalemia: Secondary | ICD-10-CM

## 2022-02-08 DIAGNOSIS — D649 Anemia, unspecified: Secondary | ICD-10-CM | POA: Diagnosis present

## 2022-02-08 DIAGNOSIS — K219 Gastro-esophageal reflux disease without esophagitis: Secondary | ICD-10-CM | POA: Diagnosis present

## 2022-02-08 DIAGNOSIS — R531 Weakness: Secondary | ICD-10-CM

## 2022-02-08 DIAGNOSIS — R0902 Hypoxemia: Secondary | ICD-10-CM | POA: Diagnosis not present

## 2022-02-08 DIAGNOSIS — N139 Obstructive and reflux uropathy, unspecified: Secondary | ICD-10-CM | POA: Diagnosis present

## 2022-02-08 DIAGNOSIS — M109 Gout, unspecified: Secondary | ICD-10-CM | POA: Diagnosis present

## 2022-02-08 DIAGNOSIS — N201 Calculus of ureter: Secondary | ICD-10-CM

## 2022-02-08 DIAGNOSIS — N1330 Unspecified hydronephrosis: Secondary | ICD-10-CM

## 2022-02-08 HISTORY — DX: Calculus of kidney: N20.0

## 2022-02-08 HISTORY — DX: Hyperlipidemia, unspecified: E78.5

## 2022-02-08 HISTORY — DX: Type 2 diabetes mellitus without complications (CMS HCC): E11.9

## 2022-02-08 HISTORY — DX: Gout, unspecified: M10.9

## 2022-02-08 HISTORY — DX: Essential (primary) hypertension: I10

## 2022-02-08 LAB — CBC WITH DIFF
BASOPHIL #: <0.1 10*3/uL (ref <=0.20)
BASOPHIL #: <0.1 10*3/uL (ref <=0.20)
BASOPHIL %: 0 %
BASOPHIL %: 0.2 %
EOSINOPHIL #: <0.1 10*3/uL (ref <=0.50)
EOSINOPHIL #: <0.1 10*3/uL (ref <=0.50)
EOSINOPHIL %: 0 %
EOSINOPHIL %: 0.1 %
HCT: 41.7 % (ref 38.9–52.0)
HCT: 36.6 % — ABNORMAL LOW (ref 38.9–52.0)
HGB: 13.6 g/dL (ref 13.4–17.5)
HGB: 12.4 g/dL — ABNORMAL LOW (ref 13.4–17.5)
IMMATURE GRANULOCYTE #: <0.1 10*3/uL (ref <0.10)
IMMATURE GRANULOCYTE #: <0.1 10*3/uL (ref <0.10)
IMMATURE GRANULOCYTE %: 0 % (ref 0.0–1.0)
IMMATURE GRANULOCYTE %: 0.2 % (ref 0.0–1.0)
LYMPHOCYTE #: 0.68 10*3/uL — ABNORMAL LOW (ref 1.00–4.80)
LYMPHOCYTE #: 0.91 10*3/uL — ABNORMAL LOW (ref 1.00–4.80)
LYMPHOCYTE %: 6 %
LYMPHOCYTE %: 7.3 %
MCH: 31.3 pg (ref 26.0–32.0)
MCH: 32 pg (ref 26.0–32.0)
MCHC: 32.6 g/dL (ref 31.0–35.5)
MCHC: 33.9 g/dL (ref 31.0–35.5)
MCV: 96.1 fL (ref 78.0–100.0)
MCV: 94.6 fL (ref 78.0–100.0)
MONOCYTE #: 0.67 10*3/uL (ref 0.20–1.10)
MONOCYTE #: 1.01 10*3/uL (ref 0.20–1.10)
MONOCYTE %: 5 %
MONOCYTE %: 8.1 %
MPV: 10.1 fL (ref 8.7–12.5)
MPV: 10 fL (ref 8.7–12.5)
NEUTROPHIL #: 11.07 10*3/uL — ABNORMAL HIGH (ref 1.50–7.70)
NEUTROPHIL #: 10.45 10*3/uL — ABNORMAL HIGH (ref 1.50–7.70)
NEUTROPHIL %: 89 %
NEUTROPHIL %: 84.1 %
PLATELETS: 303 10*3/uL (ref 150–400)
PLATELETS: 283 10*3/uL (ref 150–400)
RBC: 4.34 10*6/uL — ABNORMAL LOW (ref 4.50–6.10)
RBC: 3.87 10*6/uL — ABNORMAL LOW (ref 4.50–6.10)
RDW-CV: 13.7 % (ref 11.5–15.5)
RDW-CV: 13.8 % (ref 11.5–15.5)
WBC: 12.5 10*3/uL — ABNORMAL HIGH (ref 3.7–11.0)
WBC: 12.4 10*3/uL — ABNORMAL HIGH (ref 3.7–11.0)

## 2022-02-08 LAB — COVID-19 ~~LOC~~ MOLECULAR LAB TESTING
INFLUENZA VIRUS TYPE A: NOT DETECTED
INFLUENZA VIRUS TYPE B: NOT DETECTED
RESPIRATORY SYNCTIAL VIRUS (RSV): NOT DETECTED
SARS-CoV-2: NOT DETECTED

## 2022-02-08 LAB — BLOOD GAS W/ LACTATE REFLEX
%FIO2 (VENOUS): 21 %
BASE DEFICIT: 13.1 mmol/L — ABNORMAL HIGH (ref 0.0–3.0)
BICARBONATE (VENOUS): 14.1 mmol/L — ABNORMAL LOW (ref 22.0–29.0)
LACTATE: 7.6 mmol/L (ref <=1.9)
O2 SATURATION (VENOUS): 74.9 %
PCO2 (VENOUS): 36 mm/Hg — ABNORMAL LOW (ref 41–51)
PH (VENOUS): 7.2 — ABNORMAL LOW (ref 7.32–7.43)
PO2 (VENOUS): 50 mm/Hg

## 2022-02-08 LAB — BASIC METABOLIC PANEL
ANION GAP: 21 mmol/L — ABNORMAL HIGH (ref 4–13)
ANION GAP: 22 mmol/L — ABNORMAL HIGH (ref 4–13)
BUN/CREA RATIO: 8 (ref 6–22)
BUN/CREA RATIO: 7 (ref 6–22)
BUN: 88 mg/dL — ABNORMAL HIGH (ref 8–25)
BUN: 84 mg/dL — ABNORMAL HIGH (ref 8–25)
CALCIUM: 9.1 mg/dL (ref 8.6–10.3)
CALCIUM: 9.4 mg/dL (ref 8.6–10.3)
CHLORIDE: 100 mmol/L (ref 96–111)
CHLORIDE: 100 mmol/L (ref 96–111)
CO2 TOTAL: 13 mmol/L — ABNORMAL LOW (ref 23–31)
CO2 TOTAL: 12 mmol/L — ABNORMAL LOW (ref 23–31)
CREATININE: 11.51 mg/dL — ABNORMAL HIGH (ref 0.75–1.35)
CREATININE: 11.4 mg/dL — ABNORMAL HIGH (ref 0.75–1.35)
ESTIMATED GFR - MALE: 5 mL/min/BSA — ABNORMAL LOW (ref >=60)
ESTIMATED GFR - MALE: 5 mL/min/BSA — ABNORMAL LOW (ref >=60)
GLUCOSE: 122 mg/dL (ref 65–125)
GLUCOSE: 122 mg/dL (ref 65–125)
POTASSIUM: 6.1 mmol/L (ref 3.5–5.1)
POTASSIUM: 6.4 mmol/L (ref 3.5–5.1)
SODIUM: 134 mmol/L — ABNORMAL LOW (ref 136–145)
SODIUM: 134 mmol/L — ABNORMAL LOW (ref 136–145)

## 2022-02-08 LAB — POC BLOOD GLUCOSE (RESULTS)
GLUCOSE, POC: 110 mg/dl — ABNORMAL HIGH (ref 70–105)
GLUCOSE, POC: 110 mg/dl — ABNORMAL HIGH (ref 70–105)
GLUCOSE, POC: 129 mg/dl — ABNORMAL HIGH (ref 70–105)

## 2022-02-08 LAB — COMPREHENSIVE METABOLIC PANEL, NON-FASTING
ALBUMIN/GLOBULIN RATIO: 1.4 — ABNORMAL LOW (ref 1.5–2.5)
ALBUMIN: 4.6 g/dL (ref 3.5–5.0)
ALKALINE PHOSPHATASE: 62 U/L (ref 38–126)
ALT (SGPT): 50 U/L — ABNORMAL HIGH (ref 10–49)
ANION GAP: 27 mmol/L — ABNORMAL HIGH (ref 5–19)
AST (SGOT): 28 U/L (ref 17–59)
BILIRUBIN TOTAL: 0.6 mg/dL (ref 0.2–1.3)
BUN/CREA RATIO: 6 (ref 6–20)
BUN: 77 mg/dL — ABNORMAL HIGH (ref 9–20)
CALCIUM: 9.7 mg/dL (ref 8.4–10.2)
CHLORIDE: 93 mmol/L — ABNORMAL LOW (ref 98–107)
CO2 TOTAL: 12 mmol/L — ABNORMAL LOW (ref 22–30)
CREATININE: 12.6 mg/dL (ref 0.70–1.30)
ESTIMATED GFR: 4 mL/min/{1.73_m2} — ABNORMAL LOW (ref >60)
GLUCOSE: 170 mg/dL — ABNORMAL HIGH (ref 70–99)
POTASSIUM: 5.4 mmol/L — ABNORMAL HIGH (ref 3.5–5.0)
PROTEIN TOTAL: 7.9 g/dL (ref 6.3–8.2)
SODIUM: 132 mmol/L — ABNORMAL LOW (ref 137–145)

## 2022-02-08 LAB — ECG 12-LEAD
Atrial Rate: 102 {beats}/min
Calculated P Axis: 61 degrees
Calculated R Axis: 61 degrees
Calculated T Axis: 37 degrees
PR Interval: 124 ms
QRS Duration: 84 ms
QT Interval: 362 ms
QTC Calculation: 471 ms
Ventricular rate: 102 {beats}/min

## 2022-02-08 LAB — MAGNESIUM
MAGNESIUM: 2.1 mg/dL (ref 1.6–2.3)
MAGNESIUM: 2.1 mg/dL (ref 1.8–2.6)
MAGNESIUM: 2 mg/dL (ref 1.8–2.6)

## 2022-02-08 LAB — LACTIC ACID - FIRST REFLEX: LACTIC ACID: 7.3 mmol/L (ref 0.5–2.2)

## 2022-02-08 LAB — GRAY TOP TUBE

## 2022-02-08 LAB — HEPATITIS C ANTIBODY SCREEN WITH REFLEX TO HCV PCR: HCV ANTIBODY QUALITATIVE: NEGATIVE

## 2022-02-08 LAB — HIV1/HIV2 SCREEN, COMBINED ANTIGEN AND ANTIBODY: HIV SCREEN, COMBINED ANTIGEN & ANTIBODY: NEGATIVE

## 2022-02-08 LAB — PHOSPHORUS
PHOSPHORUS: 7.2 mg/dL — ABNORMAL HIGH (ref 2.3–4.0)
PHOSPHORUS: 7.5 mg/dL — ABNORMAL HIGH (ref 2.3–4.0)

## 2022-02-08 LAB — GOLD TOP TUBE

## 2022-02-08 LAB — TROPONIN-I
TROPONIN I: 0.05 ng/mL — ABNORMAL HIGH (ref <0.03)
TROPONIN I: 0.04 ng/mL — ABNORMAL HIGH (ref <0.03)

## 2022-02-08 LAB — BLUE TOP TUBE

## 2022-02-08 SURGERY — CYSTOSCOPY WITH URETERAL STENT INSERTION
Anesthesia: General | Laterality: Bilateral | Wound class: Clean Contaminated Wounds-The respiratory, GI, Genital, or urinary

## 2022-02-08 MED ORDER — SODIUM ZIRCONIUM CYCLOSILICATE 10 GRAM ORAL POWDER PACKET
10.0000 g | Freq: Every day | ORAL | Status: DC
Start: 2022-02-11 — End: 2022-02-10

## 2022-02-08 MED ORDER — LIDOCAINE (PF) 100 MG/5 ML (2 %) INTRAVENOUS SYRINGE
INJECTION | Freq: Once | INTRAVENOUS | Status: DC | PRN
Start: 2022-02-08 — End: 2022-02-08
  Administered 2022-02-08: 100 mg via INTRAVENOUS

## 2022-02-08 MED ORDER — LEVOFLOXACIN 500 MG/100 ML IN 5 % DEXTROSE INTRAVENOUS PIGGYBACK
500.0000 mg | INJECTION | INTRAVENOUS | Status: AC
Start: 2022-02-08 — End: 2022-02-08
  Administered 2022-02-08: 500 mg via INTRAVENOUS
  Administered 2022-02-08: 0 mg via INTRAVENOUS
  Filled 2022-02-08: qty 100

## 2022-02-08 MED ORDER — PHENYLEPHRINE 1 MG/10 ML (100 MCG/ML) IN 0.9 % SOD.CHLORIDE IV SYRINGE
INJECTION | Freq: Once | INTRAVENOUS | Status: DC | PRN
Start: 2022-02-08 — End: 2022-02-08
  Administered 2022-02-08: 200 ug via INTRAVENOUS
  Administered 2022-02-08: 100 ug via INTRAVENOUS
  Administered 2022-02-08: 200 ug via INTRAVENOUS

## 2022-02-08 MED ORDER — SODIUM CHLORIDE 0.9% FLUSH BAG - 250 ML
INTRAVENOUS | Status: DC | PRN
Start: 2022-02-08 — End: 2022-02-09

## 2022-02-08 MED ORDER — ONDANSETRON HCL (PF) 4 MG/2 ML INJECTION SOLUTION
4.0000 mg | Freq: Once | INTRAMUSCULAR | Status: DC | PRN
Start: 2022-02-08 — End: 2022-02-08

## 2022-02-08 MED ORDER — SUGAMMADEX 100 MG/ML INTRAVENOUS SOLUTION
Freq: Once | INTRAVENOUS | Status: DC | PRN
Start: 2022-02-08 — End: 2022-02-08
  Administered 2022-02-08: 200 mg via INTRAVENOUS

## 2022-02-08 MED ORDER — SODIUM CHLORIDE 0.9 % (FLUSH) INJECTION SYRINGE
2.0000 mL | INJECTION | INTRAMUSCULAR | Status: DC | PRN
Start: 2022-02-08 — End: 2022-02-15

## 2022-02-08 MED ORDER — SUCCINYLCHOLINE 20 MG/ML INTRAVENOUS WRAPPER
INJECTION | INTRAVENOUS | Status: AC
Start: 2022-02-08 — End: 2022-02-08
  Filled 2022-02-08: qty 10

## 2022-02-08 MED ORDER — CALCIUM GLUCONATE 100 MG/ML (10 %) INTRAVENOUS SOLUTION
1000.0000 mg | INTRAVENOUS | Status: AC
Start: 2022-02-08 — End: 2022-02-08
  Administered 2022-02-08: 1000 mg via INTRAVENOUS
  Filled 2022-02-08: qty 10

## 2022-02-08 MED ORDER — LACTATED RINGERS INTRAVENOUS SOLUTION
INTRAVENOUS | Status: DC | PRN
Start: 2022-02-08 — End: 2022-02-08

## 2022-02-08 MED ORDER — TAMSULOSIN 0.4 MG CAPSULE
0.4000 mg | ORAL_CAPSULE | Freq: Every evening | ORAL | Status: DC
Start: 2022-02-08 — End: 2022-02-15
  Administered 2022-02-08 – 2022-02-12 (×3): 0 mg via ORAL

## 2022-02-08 MED ORDER — LIDOCAINE (PF) 20 MG/ML (2 %) INJECTION SOLUTION
INTRAMUSCULAR | Status: AC
Start: 2022-02-08 — End: 2022-02-08
  Filled 2022-02-08: qty 5

## 2022-02-08 MED ORDER — DEXAMETHASONE SODIUM PHOSPHATE 4 MG/ML INJECTION SOLUTION
4.0000 mg | Freq: Once | INTRAMUSCULAR | Status: DC | PRN
Start: 2022-02-08 — End: 2022-02-08

## 2022-02-08 MED ORDER — ELECTROLYTE-A INTRAVENOUS SOLUTION
INTRAVENOUS | Status: DC
Start: 2022-02-09 — End: 2022-02-08
  Administered 2022-02-08: 0 mL via INTRAVENOUS

## 2022-02-08 MED ORDER — FENTANYL (PF) 50 MCG/ML INJECTION SOLUTION
INTRAMUSCULAR | Status: AC
Start: 2022-02-08 — End: 2022-02-08
  Filled 2022-02-08: qty 2

## 2022-02-08 MED ORDER — SODIUM ZIRCONIUM CYCLOSILICATE 10 GRAM ORAL POWDER PACKET
10.0000 g | Freq: Three times a day (TID) | ORAL | Status: DC
Start: 2022-02-08 — End: 2022-02-10
  Administered 2022-02-08 – 2022-02-10 (×5): 10 g via ORAL
  Filled 2022-02-08 (×6): qty 1

## 2022-02-08 MED ORDER — SODIUM CHLORIDE 0.9 % (FLUSH) INJECTION SYRINGE
2.0000 mL | INJECTION | Freq: Three times a day (TID) | INTRAMUSCULAR | Status: DC
Start: 2022-02-08 — End: 2022-02-15
  Administered 2022-02-08 – 2022-02-09 (×2): 0 mL
  Administered 2022-02-09 – 2022-02-10 (×3): 5 mL
  Administered 2022-02-10 (×2): 6 mL
  Administered 2022-02-11: 2 mL
  Administered 2022-02-11 (×2): 6 mL
  Administered 2022-02-12: 2 mL
  Administered 2022-02-12 (×2): 6 mL
  Administered 2022-02-13: 4 mL
  Administered 2022-02-13: 0 mL
  Administered 2022-02-13 – 2022-02-14 (×3): 5 mL
  Administered 2022-02-14: 4 mL
  Administered 2022-02-15: 5 mL

## 2022-02-08 MED ORDER — DEXAMETHASONE SODIUM PHOSPHATE 4 MG/ML INJECTION SOLUTION
Freq: Once | INTRAMUSCULAR | Status: DC | PRN
Start: 2022-02-08 — End: 2022-02-08
  Administered 2022-02-08: 4 mg via INTRAVENOUS

## 2022-02-08 MED ORDER — DEXTROSE 5% IN WATER (D5W) FLUSH BAG - 250 ML
INTRAVENOUS | Status: DC | PRN
Start: 2022-02-08 — End: 2022-02-09

## 2022-02-08 MED ORDER — SODIUM CHLORIDE 0.9 % IV BOLUS
1000.0000 mL | INJECTION | Status: AC
Start: 2022-02-08 — End: 2022-02-08
  Administered 2022-02-08: 1000 mL via INTRAVENOUS
  Administered 2022-02-08: 0 mL via INTRAVENOUS

## 2022-02-08 MED ORDER — FAMOTIDINE (PF) 20 MG/2 ML INTRAVENOUS SOLUTION
20.0000 mg | INTRAVENOUS | Status: AC
Start: 2022-02-08 — End: 2022-02-08
  Administered 2022-02-08: 20 mg via INTRAVENOUS
  Filled 2022-02-08: qty 2

## 2022-02-08 MED ORDER — FENTANYL (PF) 50 MCG/ML INJECTION SOLUTION
12.5000 ug | INTRAMUSCULAR | Status: DC | PRN
Start: 2022-02-08 — End: 2022-02-08

## 2022-02-08 MED ORDER — CALCIUM CHLORIDE 100 MG/ML (10 %) INTRAVENOUS SYRINGE
INJECTION | Freq: Once | INTRAVENOUS | Status: DC | PRN
Start: 2022-02-08 — End: 2022-02-08
  Administered 2022-02-08: 500 mg via INTRAVENOUS

## 2022-02-08 MED ORDER — INSULIN LISPRO 100 UNIT/ML SUB-Q SSIP
0.0000 [IU] | INJECTION | Freq: Four times a day (QID) | SUBCUTANEOUS | Status: DC | PRN
Start: 2022-02-08 — End: 2022-02-15
  Administered 2022-02-09: 4 [IU] via SUBCUTANEOUS
  Administered 2022-02-09 – 2022-02-14 (×13): 2 [IU] via SUBCUTANEOUS
  Administered 2022-02-14: 4 [IU] via SUBCUTANEOUS
  Administered 2022-02-14 – 2022-02-15 (×2): 2 [IU] via SUBCUTANEOUS
  Administered 2022-02-15: 0 [IU] via SUBCUTANEOUS
  Filled 2022-02-08: qty 6
  Filled 2022-02-08: qty 12
  Filled 2022-02-08 (×3): qty 6
  Filled 2022-02-08: qty 12
  Filled 2022-02-08 (×12): qty 6

## 2022-02-08 MED ORDER — SUCCINYLCHOLINE 20 MG/ML INTRAVENOUS WRAPPER
INJECTION | Freq: Once | INTRAVENOUS | Status: DC | PRN
Start: 2022-02-08 — End: 2022-02-08
  Administered 2022-02-08: 160 mg via INTRAVENOUS

## 2022-02-08 MED ORDER — DROPERIDOL 2.5 MG/ML INJECTION SOLUTION
0.6250 mg | Freq: Once | INTRAMUSCULAR | Status: DC | PRN
Start: 2022-02-08 — End: 2022-02-08

## 2022-02-08 MED ORDER — PROPOFOL 10 MG/ML IV BOLUS
INJECTION | Freq: Once | INTRAVENOUS | Status: DC | PRN
Start: 2022-02-08 — End: 2022-02-08
  Administered 2022-02-08: 150 mg via INTRAVENOUS

## 2022-02-08 MED ORDER — SODIUM CHLORIDE 0.9 % IRRIGATION SOLUTION
1000.0000 mL | Freq: Once | Status: DC
Start: 2022-02-08 — End: 2022-02-09
  Administered 2022-02-08: 0 mL

## 2022-02-08 MED ORDER — ALBUTEROL SULFATE HFA 90 MCG/ACTUATION AEROSOL INHALER - RN
Freq: Once | RESPIRATORY_TRACT | Status: DC | PRN
Start: 2022-02-08 — End: 2022-02-08
  Administered 2022-02-08: 5 via RESPIRATORY_TRACT

## 2022-02-08 MED ORDER — SODIUM CHLORIDE 0.9 % (FLUSH) INJECTION SYRINGE
2.0000 mL | INJECTION | INTRAMUSCULAR | Status: DC | PRN
Start: 2022-02-08 — End: 2022-02-09

## 2022-02-08 MED ORDER — ONDANSETRON HCL (PF) 4 MG/2 ML INJECTION SOLUTION
Freq: Once | INTRAMUSCULAR | Status: DC | PRN
Start: 2022-02-08 — End: 2022-02-08
  Administered 2022-02-08: 4 mg via INTRAVENOUS

## 2022-02-08 MED ORDER — PROCHLORPERAZINE EDISYLATE 10 MG/2 ML (5 MG/ML) INJECTION SOLUTION
5.0000 mg | Freq: Once | INTRAMUSCULAR | Status: DC | PRN
Start: 2022-02-08 — End: 2022-02-08

## 2022-02-08 MED ORDER — DEXTROSE 5% IN WATER (D5W) FLUSH BAG - 250 ML
INTRAVENOUS | Status: DC | PRN
Start: 2022-02-08 — End: 2022-02-15

## 2022-02-08 MED ORDER — NOREPINEPHRINE 10 MCG/ML IV DILUTION
Freq: Once | INTRAVENOUS | Status: DC | PRN
Start: 2022-02-08 — End: 2022-02-08
  Administered 2022-02-08 (×2): 6.4 ug via INTRAVENOUS

## 2022-02-08 MED ORDER — LACTATED RINGERS INTRAVENOUS SOLUTION
INTRAVENOUS | Status: DC
Start: 2022-02-08 — End: 2022-02-08

## 2022-02-08 MED ORDER — OXYCODONE-ACETAMINOPHEN 5 MG-325 MG TABLET
1.0000 | ORAL_TABLET | Freq: Once | ORAL | Status: DC | PRN
Start: 2022-02-08 — End: 2022-02-08

## 2022-02-08 MED ORDER — PROPOFOL 10 MG/ML INTRAVENOUS EMULSION
INTRAVENOUS | Status: AC
Start: 2022-02-08 — End: 2022-02-08
  Filled 2022-02-08: qty 20

## 2022-02-08 MED ORDER — FENTANYL (PF) 50 MCG/ML INJECTION SOLUTION
25.0000 ug | INTRAMUSCULAR | Status: DC | PRN
Start: 2022-02-08 — End: 2022-02-08

## 2022-02-08 MED ORDER — IOPAMIDOL 300 MG IODINE/ML (61 %) INTRATHECAL SOLUTION
10.0000 mL | Freq: Once | INTRATHECAL | Status: DC | PRN
Start: 2022-02-08 — End: 2022-02-08

## 2022-02-08 MED ORDER — SODIUM CHLORIDE 0.9 % (FLUSH) INJECTION SYRINGE
2.0000 mL | INJECTION | Freq: Three times a day (TID) | INTRAMUSCULAR | Status: DC
Start: 2022-02-09 — End: 2022-02-09
  Administered 2022-02-09 (×2): 5 mL

## 2022-02-08 MED ORDER — ROCURONIUM 10 MG/ML INTRAVENOUS SYRINGE WRAPPER
INJECTION | INTRAVENOUS | Status: AC
Start: 2022-02-08 — End: 2022-02-08
  Filled 2022-02-08: qty 5

## 2022-02-08 MED ORDER — ROCURONIUM 10 MG/ML INTRAVENOUS SYRINGE WRAPPER
INJECTION | Freq: Once | INTRAVENOUS | Status: DC | PRN
Start: 2022-02-08 — End: 2022-02-08
  Administered 2022-02-08: 10 mg via INTRAVENOUS
  Administered 2022-02-08 (×2): 15 mg via INTRAVENOUS

## 2022-02-08 MED ORDER — FENTANYL (PF) 50 MCG/ML INJECTION SOLUTION
Freq: Once | INTRAMUSCULAR | Status: DC | PRN
Start: 2022-02-08 — End: 2022-02-08
  Administered 2022-02-08: 100 ug via INTRAVENOUS

## 2022-02-08 MED ORDER — SODIUM BICARBONATE 1 MEQ/ML (8.4 %) INTRAVENOUS SOLUTION
INTRAVENOUS | Status: DC
Start: 2022-02-09 — End: 2022-02-09
  Filled 2022-02-08 (×3): qty 1000

## 2022-02-08 MED ORDER — SUGAMMADEX 100 MG/ML INTRAVENOUS SOLUTION
INTRAVENOUS | Status: AC
Start: 2022-02-08 — End: 2022-02-08
  Filled 2022-02-08: qty 5

## 2022-02-08 MED ORDER — DEXMEDETOMIDINE 4 MCG/ML IV DILUTION
Freq: Once | INTRAMUSCULAR | Status: DC | PRN
Start: 2022-02-08 — End: 2022-02-08
  Administered 2022-02-08: 12 ug via INTRAVENOUS
  Administered 2022-02-08: 16 ug via INTRAVENOUS

## 2022-02-08 MED ORDER — ONDANSETRON HCL (PF) 4 MG/2 ML INJECTION SOLUTION
8.0000 mg | INTRAMUSCULAR | Status: AC
Start: 2022-02-08 — End: 2022-02-08
  Administered 2022-02-08: 8 mg via INTRAVENOUS
  Filled 2022-02-08: qty 4

## 2022-02-08 MED ORDER — SODIUM CHLORIDE 0.9% FLUSH BAG - 250 ML
INTRAVENOUS | Status: DC | PRN
Start: 2022-02-08 — End: 2022-02-15

## 2022-02-08 MED ORDER — DEXTROSE 5 % IN WATER (D5W) INTRAVENOUS SOLUTION
2.0000 g | Freq: Once | INTRAVENOUS | Status: AC
Start: 2022-02-08 — End: 2022-02-08
  Administered 2022-02-08: 2 g via INTRAVENOUS
  Filled 2022-02-08: qty 10

## 2022-02-08 SURGICAL SUPPLY — 19 items
ARMBRD POSITION 20X8X2IN DVN FOAM (IV TUBING & ACCESSORIES) ×1 IMPLANT
BAG DRAIN NONST LF  VNYL ACT 8MM DISP GE/OEC SYS (UROLOGICAL SUPPLIES) ×1 IMPLANT
BLANKET MISTRAL-AIR ADULT UPR BODY 79X29.9IN FRC AIR HI VOL BLWR INTUITIVE CONTROL PNL LRG LED (MED SURG SUPPLIES) IMPLANT
CONV USE 320027 - CHILDRENS USE 320025 - KIT RM TURNOVER CSTM NONST LF (DRAPE/PACKS/SHEETS/OR TOWEL) ×1
CONV USE 320027 - CHILDRENS USE 320025 - KIT RM TURNOVER CUSTOM NONST LF (DRAPE/PACKS/SHEETS/OR TOWEL) ×1 IMPLANT
CONV USE ITEM 338593 - PACK SURG CYSTO PREP STRL DISP LTX (CUSTOM TRAYS & PACK) ×1 IMPLANT
GARMENT COMPRESS MED CALF CENTAURA NYL VASOGRAD LTWT BRTHBL SEQ FIL BLU 18- IN (MED SURG SUPPLIES) IMPLANT
GW URO .035IN 145CM 3.5CM AMPLATZSS STR FLX TP FLTWR OUTR COIL PTFE LF (UROLOGICAL SUPPLIES) ×1 IMPLANT
GW URO .035IN 150CM 3CM SENSOR STR FLX TP RADOPQ NITINOL SS HDRPH PTFE URET LF (UROLOGICAL SUPPLIES) IMPLANT
GW URO .035IN 150CM 3CM ZIPWR STR RADOPQ STD SHAFT STRBL KINK RST NITINOL POLYUR HDRPH URET (UROLOGICAL SUPPLIES) IMPLANT
KIT RM TURNOVER CSTM NONST LF  DISP (DRAPE/PACKS/SHEETS/OR TOWEL) ×1
MAT FLR 40X24IN ABS WTPRF BACKSHEET (MED SURG SUPPLIES) ×1 IMPLANT
PACK SURG CYSTO PREP STRL DISP LTX (CUSTOM TRAYS & PACK) ×1
SOL IRRG 0.9% NACL 3L PLASTIC CONTAINR UROMATIC LF (MEDICATIONS/SOLUTIONS) ×1 IMPLANT
STENT POLARIS ULTRA NAUT 6FR 28CM URET 2 DRMTR TAPER TIP LOW PROF GRAD PRCFLX HDR+ 2 PGTL CURVE LF (STENTS UROLOGICAL) ×1 IMPLANT
STRAP POSITION KNEE FOAM SFT ADJ CNTCT CLSR LF (MED SURG SUPPLIES) ×1 IMPLANT
TRAY CATH 16FR 1 LYR FOLEY DRAIN BAG TEMP SENSOR PERI WIPE SIL PVP 400ML 2.5L 10CC LF (UROLOGICAL SUPPLIES) ×1 IMPLANT
TUBING SUCT CLR 20FT 9/32IN MEDIVAC NCDTV M/M CONN STRL LF (MED SURG SUPPLIES) ×1 IMPLANT
WATER STRL 2000ML PLASTIC CONTAINR UROMATIC LF (MED SURG SUPPLIES) IMPLANT

## 2022-02-08 NOTE — ED Nurses Note (Signed)
[place] supervisor from [facility] called to say they do not have urology this weekend and are unable to take this patient as a transfer.  Dr Derek Mound notified.

## 2022-02-08 NOTE — Nurses Notes (Signed)
Patient arrived to MICU 20 from [place]. Primary RN and MICU service at bedside. Bicarb gtt currently running, RA. VS and assessments per flowsheets.

## 2022-02-08 NOTE — Nurses Notes (Signed)
Septic alert popped up - service notified. Asked service for tele orders and repeat BMP.

## 2022-02-08 NOTE — ED Nurses Note (Signed)
Janelle called back from [facility]. Dr Collier Flowers is unable to accept patient recommends patient go to tertiary facility.

## 2022-02-08 NOTE — Ancillary Notes (Signed)
Pt transported to ID and returned to ED via wheelchair.  Pt tolerated exam well.

## 2022-02-08 NOTE — Anesthesia Preprocedure Evaluation (Signed)
ANESTHESIA PRE-OP EVALUATION  Planned Procedure: CYSTOSCOPY WITH URETERAL STENT INSERTION (Bilateral)  Review of Systems     anesthesia history negative     patient summary reviewed  nursing notes reviewed        Pulmonary  negative pulmonary ROS,   not a current smoker   Cardiovascular    Hypertension ,       GI/Hepatic/Renal    GERD (worse with supine position today) and kidney stones        Endo/Other      type 2 diabetes    Neuro/Psych/MS   negative neuro/psych ROS,      Cancer                        Physical Assessment      Airway       Mallampati: III    TM distance: >3 FB    Neck ROM: full  Mouth Opening: good.  Facial hair          Dental           (+) chipped, missing           Pulmonary    Breath sounds clear to auscultation  (-) no wheezes     Cardiovascular    Rhythm: regular  Rate: Normal       Other findings              Plan  ASA 3 - emergent     Planned anesthesia type: general     general anesthesia with endotracheal tube intubation      PONV Plan:  I plan to administer pharmcologic prophalaxis antiemetics          Additional Plans: RSI    Intravenous induction     Anesthesia issues/risks discussed are: Dental Injuries, Post-op Intubation/Ventilation, PONV, Cardiac Events/MI, Intraoperative Awareness/ Recall, Blood Loss, Sore Throat, Aspiration and Post-op Pain Management.      Use of blood products discussed with patient who consented to blood products.        NPO Status: Full stomach precautions.         Plan discussed with CRNA.

## 2022-02-08 NOTE — ED Triage Notes (Signed)
Ambulates into ED. Reports he started on Sunday and has been unable to urinate or have a BM. States he finally had a BM this morning, but it was very "thin". Complains of vomiting, nausea, hiccups since yesterday morning, shortness of breath, and fatigue.     Has had scant urine. Also complains of indigestion.     Reports he did take his medications this morning.

## 2022-02-08 NOTE — ED Nurses Note (Signed)
Pt being transported to 5N OR via stretcher by [facility]. All belongings given to wife (wedding ring, clothing,glasses).

## 2022-02-08 NOTE — ED Provider Notes (Signed)
[facility]  ED Primary Provider Note  History of Present Illness   No chief complaint on file.    Travis [name] is a 61 y.o. male who had no chief complaint listed for this encounter.  Arrival: The patient arrived by Car    This is a 61 year old male, [birth year] who presents to the emergency room with vomiting since Sunday (3 days ago).  Patient reports he has had no urine output.  He complains of nausea, vomiting, hiccups, shortness breath and fatigue.  He has only had a small bowel movement since becoming ill.  Patient was nontoxic appearance upon arrival        History Reviewed This Encounter: Medical History  Surgical History  Family History  Social History    Physical Exam   ED Triage Vitals [02/08/22 0922]   BP (Non-Invasive) (!) 163/88   Heart Rate 93   Respiratory Rate 20   Temperature 36.1 C (96.9 F)   SpO2 94 %   Weight 99.8 kg (220 lb)   Height 1.88 m (6' 2")     Physical Exam  Vitals and nursing note reviewed.   Constitutional:       Appearance: Normal appearance. He is normal weight. He is not ill-appearing.   HENT:      Mouth/Throat:      Mouth: Mucous membranes are dry.      Pharynx: Oropharynx is clear.   Eyes:      Extraocular Movements: Extraocular movements intact.   Cardiovascular:      Rate and Rhythm: Normal rate and regular rhythm.   Pulmonary:      Effort: Pulmonary effort is normal.      Breath sounds: Normal breath sounds.   Abdominal:      General: Abdomen is flat.      Palpations: Abdomen is soft.      Tenderness: There is no abdominal tenderness.   Musculoskeletal:         General: No tenderness. Normal range of motion.      Cervical back: Normal range of motion and neck supple. No rigidity.   Skin:     General: Skin is warm and dry.      Capillary Refill: Capillary refill takes less than 2 seconds.   Neurological:      General: No focal deficit present.      Mental Status: He is alert and oriented to person, place, and time.       Patient Data     Labs  Ordered/Reviewed   COMPREHENSIVE METABOLIC PANEL, NON-FASTING - Abnormal; Notable for the following components:       Result Value    SODIUM 132 (*)     POTASSIUM 5.4 (*)     CHLORIDE 93 (*)     CO2 TOTAL 12 (*)     ANION GAP 27 (*)     BUN 77 (*)     CREATININE 12.60 (*)     ESTIMATED GFR 4 (*)     GLUCOSE 170 (*)     ALT (SGPT) 50 (*)     ALBUMIN/GLOBULIN RATIO 1.4 (*)     All other components within normal limits    Narrative:     Estimated Glomerular Filtration Rate (eGFR) is calculated using the CKD-EPI (2021) equation, intended for patients 14 years of age and older. If gender is not documented or "unknown", there will be no eGFR calculation.   TROPONIN-I - Abnormal; Notable for the following components:  TROPONIN I 0.05 (*)     All other components within normal limits   CBC WITH DIFF - Abnormal; Notable for the following components:    WBC 12.5 (*)     RBC 4.34 (*)     NEUTROPHIL # 11.07 (*)     LYMPHOCYTE # 0.68 (*)     All other components within normal limits   TROPONIN-I - Abnormal; Notable for the following components:    TROPONIN I 0.04 (*)     All other components within normal limits   COVID-19 [place] MOLECULAR LAB TESTING - Normal    Narrative:     Results are for the simultaneous qualitative identification of SARS-CoV-2 (formerly 2019-nCoV), Influenza A, Influenza B, and RSV RNA. These etiologic agents are generally detectable in nasopharyngeal and nasal swabs during the ACUTE PHASE of infection. Hence, this test is intended to be performed on respiratory specimens collected from individuals with signs and symptoms of upper respiratory tract infection who meet Centers for Disease Control and Prevention (CDC) clinical and/or epidemiological criteria for Coronavirus Disease 2019 (COVID-19) testing. CDC COVID-19 criteria for testing on human specimens is available at [facility] webpage information for Healthcare Professionals: Coronavirus Disease 2019 (COVID-19)  ([employer]uk).     False-negative results may occur if the virus has genomic mutations, insertions, deletions, or rearrangements or if performed very early in the course of illness. Otherwise, negative results indicate virus specific RNA targets are not detected, however negative results do not preclude SARS-CoV-2 infection/COVID-19, Influenza, or Respiratory syncytial virus infection. Results should not be used as the sole basis for patient management decisions. Negative results must be combined with clinical observations, patient history, and epidemiological information. If upper respiratory tract infection is still suspected based on exposure history together with other clinical findings, re-testing should be considered.    Disclaimer:   This assay has been authorized by FDA under an Emergency Use Authorization for use in laboratories certified under the Clinical Laboratory Improvement Amendments of 1988 (CLIA), 42 U.S.C. 4131721509, to perform high complexity tests. The impacts of vaccines, antiviral therapeutics, antibiotics, chemotherapeutic or immunosuppressant drugs have not been evaluated.     Test methodology:   Cepheid Xpert Xpress SARS-CoV-2/Flu/RSV Assay real-time polymerase chain reaction (RT-PCR) test on the GeneXpert Dx and Xpert Xpress systems.   MAGNESIUM - Normal   CBC/DIFF    Narrative:     The following orders were created for panel order CBC/DIFF.  Procedure                               Abnormality         Status                     ---------                               -----------         ------                     CBC WITH DIFF[[id number]]                Abnormal            Final result                 Please view results for these tests on the individual orders.   BLUE  TOP TUBE   GRAY TOP TUBE   EXTRA TUBES    Narrative:     The following orders were created for panel order EXTRA TUBES.  Procedure                               Abnormality          Status                     ---------                               -----------         ------                     BLUE TOP [phone]                                    Final result               GOLD TOP FYBO[[id number]]                                    In process                 GRAY TOP [phone]                                    Final result                 Please view results for these tests on the individual orders.   GOLD TOP TUBE     CT RENAL CALCULI SERIES WO IV CONTRAST   Final Result by Edi, Radresults In (12/20 1100)   BILATERAL URETERAL CALCULI WITH MODERATE TO SEVERE HYDRONEPHROSIS ON THE RIGHT.          One or more dose reduction techniques were used (e.g., Automated exposure control, adjustment of the mA and/or kV according to patient size, use of iterative reconstruction technique).         Radiologist location ID: [id number]         XR ABD FLAT AND UPRIGHT SERIES (W PA CHEST)   Final Result by Edi, Radresults In (12/20 1017)   POTENTIAL LARGE RIGHT URETERAL CALCULUS.            Radiologist location ID: [place] Decision Making        Medical Decision Making  Differential diagnosis:  Acute renal  Gastroenteritis  Dehydration  Small-bowel obstruction    Plan:  Normal saline 1 L over an hour  Zofran 8 mg IV  CBC, comprehensive profile, COVID, IV him, troponin  Abdominal series    Patient has a 9 mm right ureter stone with severe hydro nephrosis.  Patient was now in renal failure with a BUN of 77 creatinine 12.6  Patient has been accepted by Dr. Ilona Sorrel at [facility] as he needs intervention  Patient will go to the ER of the care Dr. Nena Jordan at [facility] ER    Amount and/or Complexity of [place]:  Details: 1145 - consultation with Urology [facility]: ordered.     Details: BUN 77/creatinine 12.6  Radiology: ordered.    Risk  Prescription drug management.  Risk Details: Patient is in acute renal failure secondary to a 9 mm stone in  the right ureter causing severe hydronephrosis.  He will be accepted at [facility] as an ER to ER transfer.  Accepting physician is Dr. Ilona Sorrel             Medications Administered in the ED   levoFLOXacin (LEVAQUIN) 500 mg in D5W 100 mL premix IVPB (500 mg Intravenous New Bag/New Syringe 02/08/22 1248)   ondansetron (ZOFRAN) 2 mg/mL injection (8 mg Intravenous Given 02/08/22 1032)   NS bolus infusion 1,000 mL (0 mL Intravenous Stopped 02/08/22 1214)   famotidine (PEPCID) 10 mg/mL injection (20 mg Intravenous Given 02/08/22 1115)     Clinical Impression   Acute renal failure, unspecified acute renal failure type (CMS HCC) (Primary)   Renal stone - right /large/obstructing   Hydronephrosis, unspecified hydronephrosis type - severe       Disposition: Transfered to Another Facility

## 2022-02-08 NOTE — ED Nurses Note (Signed)
Called Dr Reggy Eye office to see who was on call. She gave me the number to [facility] the NP [phone].  Dr Derek Mound speaking to [place] at this time.

## 2022-02-08 NOTE — ED Nurses Note (Signed)
Dr Derek Mound speaking with [facility] transfer line.

## 2022-02-08 NOTE — ED Nurses Note (Signed)
Accepted ED to ED at [facility] by Dr. Ilona Sorrel, Urology and Dr. Tawny Asal, ED.

## 2022-02-08 NOTE — ED Nurses Note (Signed)
Ems here to take patient to [facility] ER. Patient escorted off of the department without incident.

## 2022-02-08 NOTE — H&P (Signed)
[facility]  MICU History and Physical    Travis [name], Travis [name], 61 y.o. male  Encounter Start Date:  02/08/2022  [facility]:  [facility]  Date of service: 02/08/2022  Date of Birth:  07/08/1960    PCP: Karlyne GreenspanMorgan Leigh Stephen, NP    Information Obtained from: patient and history reviewed via medical record  Chief Complaint:  Urinary retention    HPI: Travis [name] is a very pleasant 61 y.o. male who presents to the MICU as a transfer for obstructive uropathy and hyperkalemia.  He has a past medical history significant for recurrent nephrolithiasis, hypertension, type 2 DM, and gout.  He [facility] to our ED [facility] he presented for 3 days of vomiting and inability to tolerate p.o. intake.  He also stated that time he had no urine output and also had small complaints of chest pain with shortness of breath associated with it.  Because of his inability to tolerate p.o., the patient has had little to no bowel movement since that time in addition to his urinary retention.  [facility] a 9 mm right ureteral stone with severe HUN and resultant acute renal failure with a creatinine of 12.6, BUN 77, potassium of 5.4 and a bicarb of 12.  [facility] his hyperkalemia had worsened to 6.1 while the remainder of his renal parameters remained largely the same.  Urology was consulted who urgently took the patient to the OR for rigid cystourethroscopy, retrograde pyelography and bilateral double-J ureteral stent placement.  Following the OR the patient was admitted and has been vitally stable but triggered a sepsis alert due to some hypertension which is baseline for him as well as mild tachycardia to the low 100s.  Urology was contacted who ordered a stat repeat BMP and telemetry.  There were no EKG changes noted on his tracing but hyperkalemia has worsened to 6.4 prompting  admission to the MICU for more urgent care.  At the bedside the patient is in no acute distress and asymptomatic.  He states the pressure that he had in his lower abdomen prior to the OR has resolved.  He is without fevers, chills, diaphoresis, chest pain, arrhythmia, shortness of breath, abdominal pain.  His Foley has produced around 280cc of clear red urine.  States he had a small bowel movement last yesterday but has been having minimal again since his poor p.o. intake.    Past Medical History:   Diagnosis Date    Diabetes mellitus, type 2 (CMS HCC)     Gout, unspecified     HTN (hypertension)     Kidney stones          Past Surgical History:   Procedure Laterality Date    FINGER SURGERY      HX WISDOM TEETH EXTRACTION      LITHOTRIPSY             Medications Prior to Admission       Prescriptions    Cholecalciferol, Vitamin D3, (VITAMIN D-3) 50 mcg (2,000 unit) Oral Capsule    Take by mouth    Febuxostat (ULORIC) 80 mg Oral Tablet    Take 1 Tablet (80 mg total) by mouth Once a day    glyBURIDE (DIABETA) 2.5 mg Oral Tablet    Take 1 Tablet (2.5 mg total) by mouth Every morning with breakfast    lisinopriL (PRINIVIL) 20 mg Oral Tablet    Take 1 Tablet (20 mg  total) by mouth Once a day    pioglitazone (ACTOS) 15 mg Oral Tablet    Take 1 Tablet (15 mg total) by mouth Once a day    rosuvastatin (CRESTOR) 10 mg Oral Tablet    Take 1 Tablet (10 mg total) by mouth Every evening    SITagliptin phos-metformin (JANUMET) 50-1,000 mg Oral Tablet    Take 1 Tablet by mouth Twice daily          [START ON 02/09/2022] D5W 1,000 mL with sodium bicarbonate 150 mEq infusion, , Intravenous, Continuous  D5W 250 mL flush bag, , Intravenous, Q15 Min PRN  D5W 250 mL flush bag, , Intravenous, Q15 Min PRN  NS 250 mL flush bag, , Intravenous, Q15 Min PRN  NS 250 mL flush bag, , Intravenous, Q15 Min PRN  NS flush syringe, 2-6 mL, Intracatheter, Q8HRS  NS flush syringe, 2-6 mL, Intracatheter, Q1 MIN PRN  [START ON 02/09/2022] NS flush  syringe, 2-6 mL, Intracatheter, Q8HRS  NS flush syringe, 2-6 mL, Intracatheter, Q1 MIN PRN  NS irrigation, 1,000 mL, Irrigation, Once  sodium zirconium cyclosilicate (LOKELMA) powder, 10 g, Oral, 3x/day   Followed by  Melene Muller ON 02/11/2022] sodium zirconium cyclosilicate (LOKELMA) powder, 10 g, Oral, Daily  [Held by provider] tamsulosin (FLOMAX) capsule, 0.4 mg, Oral, Daily after Dinner        No Known Allergies    Family History  None known    Social History  Social History     Socioeconomic History    Marital status: Married     Spouse name: Not on file    Number of children: Not on file    Years of education: Not on file    Highest education level: Not on file   Occupational History    Not on file   Tobacco Use    Smoking status: Never    Smokeless tobacco: Former     Types: Snuff     Quit date: 02/03/2022   Vaping Use    Vaping Use: Never used   Substance and Sexual Activity    Alcohol use: Never    Drug use: Never    Sexual activity: Not on file   Other Topics Concern    Not on file   Social History Narrative    Not on file     Social Determinants of Health     Financial Resource Strain: Not on file   Transportation Needs: Not on file   Social Connections: Low Risk  (02/08/2022)    Social Connections     SDOH Social Isolation: 5 or more times a week   Intimate Partner Violence: Not on file   Housing Stability: Not on file       ROS:   Other than ROS in the HPI, all other systems were negative.    EXAM:  Temperature: 36.2 C (97.2 F)  Heart Rate: 93  BP (Non-Invasive): (!) 142/69  Respiratory Rate: 16  SpO2: 93 %  Gen: Alert, pleasant, NAD.  HEENT: PERRLA, EOMI, MM moist.  CV: RRR, no murmurs or gallops.  Resp: CTABL, no crackles or wheeze.  Abd: Soft, non-tender, BS diminished.  Ext: No edema or cyanosis.  Neuro: CN II-XII grossly intact. No FND.    Labs:    Lab Results Today:    Results for orders placed or performed during the hospital encounter of 02/08/22 (from the past 24 hour(s))   BASIC METABOLIC PANEL    Result Value Ref Range  SODIUM 134 (L) 136 - 145 mmol/L    POTASSIUM 6.1 (HH) 3.5 - 5.1 mmol/L    CHLORIDE 100 96 - 111 mmol/L    CO2 TOTAL 13 (L) 23 - 31 mmol/L    ANION GAP 21 (H) 4 - 13 mmol/L    CALCIUM 9.1 8.6 - 10.3 mg/dL    GLUCOSE 601 65 - 093 mg/dL    BUN 88 (H) 8 - 25 mg/dL    CREATININE 23.55 (H) 0.75 - 1.35 mg/dL    BUN/CREA RATIO 8 6 - 22    ESTIMATED GFR - MALE 5 (L) >=60 mL/min/BSA   HEPATITIS C ANTIBODY SCREEN WITH REFLEX TO HCV PCR   Result Value Ref Range    HCV ANTIBODY QUALITATIVE Negative Negative   HIV1/HIV2 SCREEN, COMBINED ANTIGEN AND ANTIBODY   Result Value Ref Range    HIV SCREEN, COMBINED ANTIGEN & ANTIBODY Negative Negative   CBC WITH DIFF   Result Value Ref Range    WBC 12.4 (H) 3.7 - 11.0 x10^3/uL    RBC 3.87 (L) 4.50 - 6.10 x10^6/uL    HGB 12.4 (L) 13.4 - 17.5 g/dL    HCT 73.2 (L) 20.2 - 52.0 %    MCV 94.6 78.0 - 100.0 fL    MCH 32.0 26.0 - 32.0 pg    MCHC 33.9 31.0 - 35.5 g/dL    RDW-CV 54.2 70.6 - 23.7 %    PLATELETS 283 150 - 400 x10^3/uL    MPV 10.0 8.7 - 12.5 fL    NEUTROPHIL % 84.1 %    LYMPHOCYTE % 7.3 %    MONOCYTE % 8.1 %    EOSINOPHIL % 0.1 %    BASOPHIL % 0.2 %    NEUTROPHIL # 10.45 (H) 1.50 - 7.70 x10^3/uL    LYMPHOCYTE # 0.91 (L) 1.00 - 4.80 x10^3/uL    MONOCYTE # 1.01 0.20 - 1.10 x10^3/uL    EOSINOPHIL # <0.10 <=0.50 x10^3/uL    BASOPHIL # <0.10 <=0.20 x10^3/uL    IMMATURE GRANULOCYTE % 0.2 0.0 - 1.0 %    IMMATURE GRANULOCYTE # <0.10 <0.10 x10^3/uL   MAGNESIUM   Result Value Ref Range    MAGNESIUM 2.1 1.8 - 2.6 mg/dL   PHOSPHORUS   Result Value Ref Range    PHOSPHORUS 7.2 (H) 2.3 - 4.0 mg/dL   ECG 62-GBTD   Result Value Ref Range    Ventricular rate 102 BPM    Atrial Rate 102 BPM    PR Interval 124 ms    QRS Duration 84 ms    QT Interval 362 ms    QTC Calculation 471 ms    Calculated P Axis 61 degrees    Calculated R Axis 61 degrees    Calculated T Axis 37 degrees   BLOOD GAS W/ LACTATE REFLEX Venous   Result Value Ref Range    %FIO2 (VENOUS) 21.0 %    PH (VENOUS)  7.20 (L) 7.32 - 7.43    PCO2 (VENOUS) 36 (L) 41 - 51 mm/Hg    PO2 (VENOUS) 50 mm/Hg    BICARBONATE (VENOUS) 14.1 (L) 22.0 - 29.0 mmol/L    BASE DEFICIT 13.1 (H) 0.0 - 3.0 mmol/L    O2 SATURATION (VENOUS) 74.9 %    LACTATE 7.6 (HH) <=1.9 mmol/L   POC BLOOD GLUCOSE (RESULTS)   Result Value Ref Range    GLUCOSE, POC 110 (H) 70 - 105 mg/dl   POC BLOOD GLUCOSE (RESULTS)   Result Value Ref Range  GLUCOSE, POC 110 (H) 70 - 105 mg/dl   LACTIC ACID - FIRST REFLEX   Result Value Ref Range    LACTIC ACID 7.3 (HH) 0.5 - 2.2 mmol/L   BASIC METABOLIC PANEL   Result Value Ref Range    SODIUM 134 (L) 136 - 145 mmol/L    POTASSIUM 6.4 (HH) 3.5 - 5.1 mmol/L    CHLORIDE 100 96 - 111 mmol/L    CO2 TOTAL 12 (L) 23 - 31 mmol/L    ANION GAP 22 (H) 4 - 13 mmol/L    CALCIUM 9.4 8.6 - 10.3 mg/dL    GLUCOSE 903 65 - 009 mg/dL    BUN 84 (H) 8 - 25 mg/dL    CREATININE 23.30 (H) 0.75 - 1.35 mg/dL    BUN/CREA RATIO 7 6 - 22    ESTIMATED GFR - MALE 5 (L) >=60 mL/min/BSA   PHOSPHORUS   Result Value Ref Range    PHOSPHORUS 7.5 (H) 2.3 - 4.0 mg/dL   MAGNESIUM   Result Value Ref Range    MAGNESIUM 2.0 1.8 - 2.6 mg/dL   POC BLOOD GLUCOSE (RESULTS)   Result Value Ref Range    GLUCOSE, POC 129 (H) 70 - 105 mg/dl   Results for orders placed or performed during the hospital encounter of 02/08/22 (from the past 24 hour(s))   COMPREHENSIVE METABOLIC PANEL, NON-FASTING   Result Value Ref Range    SODIUM 132 (L) 137 - 145 mmol/L    POTASSIUM 5.4 (H) 3.5 - 5.0 mmol/L    CHLORIDE 93 (L) 98 - 107 mmol/L    CO2 TOTAL 12 (L) 22 - 30 mmol/L    ANION GAP 27 (H) 5 - 19 mmol/L    BUN 77 (H) 9 - 20 mg/dL    CREATININE 07.62 (HH) 0.70 - 1.30 mg/dL    BUN/CREA RATIO 6 6 - 20    ESTIMATED GFR 4 (L) >60 mL/min/1.19m^2    ALBUMIN 4.6 3.5 - 5.0 g/dL    CALCIUM 9.7 8.4 - 26.3 mg/dL    GLUCOSE 335 (H) 70 - 99 mg/dL    ALKALINE PHOSPHATASE 62 38 - 126 U/L    ALT (SGPT) 50 (H) 10 - 49 U/L    AST (SGOT) 28 17 - 59 U/L    BILIRUBIN TOTAL 0.6 0.2 - 1.3 mg/dL    PROTEIN TOTAL 7.9  6.3 - 8.2 g/dL    ALBUMIN/GLOBULIN RATIO 1.4 (L) 1.5 - 2.5   MAGNESIUM   Result Value Ref Range    MAGNESIUM 2.1 1.6 - 2.3 mg/dL   TROPONIN-I   Result Value Ref Range    TROPONIN I 0.05 (H) <0.03 ng/mL   CBC WITH DIFF   Result Value Ref Range    WBC 12.5 (H) 3.7 - 11.0 x10^3/uL    RBC 4.34 (L) 4.50 - 6.10 x10^6/uL    HGB 13.6 13.4 - 17.5 g/dL    HCT 45.6 25.6 - 38.9 %    MCV 96.1 78.0 - 100.0 fL    MCH 31.3 26.0 - 32.0 pg    MCHC 32.6 31.0 - 35.5 g/dL    RDW-CV 37.3 42.8 - 76.8 %    PLATELETS 303 150 - 400 x10^3/uL    MPV 10.1 8.7 - 12.5 fL    NEUTROPHIL % 89.0 %    LYMPHOCYTE % 6.0 %    MONOCYTE % 5.0 %    EOSINOPHIL % 0.0 %    BASOPHIL % 0.0 %  NEUTROPHIL # 11.07 (H) 1.50 - 7.70 x10^3/uL    LYMPHOCYTE # 0.68 (L) 1.00 - 4.80 x10^3/uL    MONOCYTE # 0.67 0.20 - 1.10 x10^3/uL    EOSINOPHIL # <0.10 <=0.50 x10^3/uL    BASOPHIL # <0.10 <=0.20 x10^3/uL    IMMATURE GRANULOCYTE % 0.0 0.0 - 1.0 %    IMMATURE GRANULOCYTE # <0.10 <0.10 x10^3/uL   BLUE TOP TUBE   Result Value Ref Range    RAINBOW/EXTRA TUBE AUTO RESULT Yes    GOLD TOP TUBE   Result Value Ref Range    RAINBOW/EXTRA TUBE AUTO RESULT Yes    GRAY TOP TUBE   Result Value Ref Range    RAINBOW/EXTRA TUBE AUTO RESULT Yes    COVID - 19 SCREENING -Symptomatic   Result Value Ref Range    SARS-CoV-2 Not Detected Not Detected, Indeterminate    INFLUENZA VIRUS TYPE A Not Detected Not Detected    INFLUENZA VIRUS TYPE B Not Detected Not Detected    RESPIRATORY SYNCTIAL VIRUS (RSV) Not Detected Not Detected   TROPONIN-I   Result Value Ref Range    TROPONIN I 0.04 (H) <0.03 ng/mL       Imaging Studies:  No results found.      DNR Status:  Full Code    Assessment/Plan:  Active Hospital Problems    Diagnosis    Primary Problem: Obstructive uropathy     Mr. Roy is a very pleasant 61 y.o. male who presents to the MICU as a transfer for obstructive uropathy and hyperkalemia s/p bilateral double J ureteral stent placement 12/20.  He has a past medical history significant for  recurrent nephrolithiasis, hypertension, hyperlipidemia, type 2 DM, and gout.    Obstructive uropathy 2/2 bilateral obstructing stones with HUN s/p BL ureteral double J stent placement  Hyperkalemia  HAGMA 2/2 lactic acidosis and elevated BUN  Hx of recurrent nephrolithiasis  - Decompression with stent placement and foley draining urine expected to improve acidosis and hyperkalemia.  - Asymptomatic and without EKG changes.  - Given 500mg  CaCl and 1g Ca gluconate prior to OR.  - Starting bicarb gtt and Lokelma.  - Repeat gas now and trend with BMP q4h overnight.    HTN  - Hold home meds given AKI and hyperK.    T2DM  - Hold home orals. SSI Protocol.  - Renal diet for now.    Gout  - Hold home febuxostat.    DVT/PE Prophylaxis: SCDs/ Venodynes/Impulse boots. Hold chemical ppx post-procedure.    , DO  Medicine PGY-3          Late entry for 02/08/22. I did not evaluate the patient on this date of service.  I reviewed the resident's note.  I agree with the findings and plan of care as documented in the resident's note.  Any exceptions/additions are edited/noted.    02/10/22, MD

## 2022-02-08 NOTE — Consults (Signed)
[facility]  DEPARTMENT OF UROLOGY  INITIAL CONSULT NOTE    Patient: Travis, [name], 61 y.o. male  MRN: [id number]  Date of Admission:  02/08/2022  Date of Service:  02/08/2022  Date of Birth:  Apr 04, 1960  PCP: Travis Greenspan, NP.  Information obtained from: Patient, health care provider, history reviewed via medical record  Consult requested by: ED    Consult for: bilateral obstructing stones     Impression:   61 y.o. male with:    Bilateral obstructing stones  right 1 cm mid ureteral stone, 6 mm distal ureteral stone  AKI  creatinine 11.51  Hyperkalemia  K 6.1  Bilateral hydroureteronephrosis  Oliguria  PVR 40   Medical comorbidities including HTN, DM2    Recommendations:      - maintain NPO for OR today  - patient will likely require medical admission following surgery for monitoring given electrolyte abnormalities   - consent signed scanned into chart  - preoperative antibiotics ordered         HPI:    Travis [name] is a 61 y.o. male with history of nephrolithiasis initially presented to outside hospital was found to have bilateral obstructing ureteral stones and AKI.  He was transferred to [facility] for additional evaluation.  Currently he denies flank or abdominal pain.  Began having nausea vomiting for approximately 3 days and decreased urine output yesterday and today prompted presentation. Patient has 30 year history of kidney stones, however has never required ureteral stenting or ureteroscopy.  He has had as well in the past in [place].  Previously was stones he has gotten severe flank pain, however has had no flank over the last few days to weeks.    Diet (last meal):  NPO    My interpretation of imaging: Bilateral obstructing ureteral stones, right 1 cm mid ureteral stone, 6 mm distal ureteral stone with associated upstream hydroureteronephrosis      Relevant Labs:   BMP (Last 48 Hours):    Recent Results in last 48 hours     02/08/22  0947 02/08/22  1700   SODIUM  132* 134*   POTASSIUM 5.4* 6.1*   CHLORIDE 93* 100   CO2 12* 13*   BUN 77* 88*   CREATININE 12.60* 11.51*   CALCIUM 9.7 9.1   GLUCOSENF 170*  --       CBC with Diff (Last 24 Hours):    Recent Results last 24 hours     02/08/22  0947 02/08/22  1700   WBC 12.5* 12.4*   HGB 13.6 12.4*   HCT 41.7 36.6*   MCV 96.1 94.6   PLTCNT 303 283   PMNS 89.0 84.1   LYMPHO 6.0 7.3   MONOCYTES 5.0 8.1   EOSINO 0.0 0.1   BASOPHILS 0.0  <0.10 0.2  <0.10       Creatinine:   Lab Results   Component Value Date    CREATININE 11.51 (H) 02/08/2022           PHYSICAL EXAMINATION:    Temperature: 36.6 C (97.8 F)  Heart Rate: (!) 102  BP (Non-Invasive): (!) 185/76  Respiratory Rate: 16  SpO2: 95 %  Constitutional (e.g. vital signs, general appearance) - 61 y.o. male, NAD  Abdomen - Soft, non-distended, non-tender  Neurological - CN II-XII grossly intact, A&O x 3  Psychiatric - Normal affect  Genitourinary - No flank or suprapubic tenderness         ROS:  ROS Other than ROS in the HPI, all other systems were negative.    PAST MEDICAL/ FAMILY/ SOCIAL HISTORY:    PAST MEDICAL:    Past Medical History:   Diagnosis Date    Diabetes mellitus, type 2 (CMS HCC)     Gout, unspecified     HTN (hypertension)     Kidney stones         Past Surgical History:   Procedure Laterality Date    FINGER SURGERY      HX WISDOM TEETH EXTRACTION      LITHOTRIPSY              Medications Prior to Admission       Prescriptions    Cholecalciferol, Vitamin D3, (VITAMIN D-3) 50 mcg (2,000 unit) Oral Capsule    Take by mouth    Febuxostat (ULORIC) 80 mg Oral Tablet    Take 1 Tablet (80 mg total) by mouth Once a day    glyBURIDE (DIABETA) 2.5 mg Oral Tablet    Take 1 Tablet (2.5 mg total) by mouth Every morning with breakfast    lisinopriL (PRINIVIL) 20 mg Oral Tablet    Take 1 Tablet (20 mg total) by mouth Once a day    pioglitazone (ACTOS) 15 mg Oral Tablet    Take 1 Tablet (15 mg total) by mouth Once a day    rosuvastatin (CRESTOR) 10 mg Oral Tablet    Take 1  Tablet (10 mg total) by mouth Every evening    SITagliptin phos-metformin (JANUMET) 50-1,000 mg Oral Tablet    Take 1 Tablet by mouth Twice daily          No Known Allergies    Social History  Social History     Socioeconomic History    Marital status: Married   Tobacco Use    Smoking status: Never    Smokeless tobacco: Former     Types: Snuff     Quit date: 02/03/2022   Vaping Use    Vaping Use: Never used   Substance and Sexual Activity    Alcohol use: Never    Drug use: Never         Isac Caddy, MD   [facility]  Department of Urology   02/08/2022, 17:43       I saw and examined the patient.  I reviewed the resident's note.  I agree with the findings and plan of care as documented in the resident's note.  Any exceptions/additions are edited/noted.    Additional Attending Documentation: None    Merian Capron, MD 02/12/2022, 12:21  Genitourinary Reconstructive Surgery  Assistant Professor - Department of Urology

## 2022-02-08 NOTE — ED Provider Notes (Signed)
[facility] - Emergency Department  ED Primary Provider Note  History of Present Illness   Chief Complaint   Patient presents with    Acute Renal Failure     59mm renal calculi at [facility], elevated creatinine BUN.      Alan Mercier is a 61 y.o. male who had concerns including Acute Renal Failure.  Arrival: The patient arrived by Ambulance    One week ago, the patient reports he began experiencing burning flank pain and intermittent vomiting. He admits he has barely urinated in the last 3-4 days, noting a tablespoon today was the most he has expelled, prompting him to [place]. While there, imaging showed a 9 mm renal calculi and lab work showed elevated creatinine and BUN, prompting his transfer to [facility].  In the ED, the patient admits he has a history of many kidney stones, but has always been able to pass them by himself at home. He states he is usually able to tell when he is about to pass them. Patient endorses normal kidney function at baseline. He denies any dysuria, hematuria, fevers, or abdominal pain, and has no other concerns or complaints at this time.   Per charting, PMH includes DM II, HTN, and finger surgery. Refer to charting for list of medications that does not include any anticoagulants. NKDA.         History Reviewed This Encounter:      Physical Exam   ED Triage Vitals   BP (Non-Invasive) 02/08/22 1640 (!) 185/76   Heart Rate 02/08/22 1640 (!) 102   Respiratory Rate 02/08/22 1640 16   Temperature 02/08/22 1640 36.6 C (97.8 F)   SpO2 02/08/22 1640 95 %   Weight 02/08/22 2044 109 kg (239 lb 13.8 oz)   Height 02/08/22 1640 1.88 m (6\' 2" )     Physical Exam  Constitutional:       General: He is not in acute distress.     Appearance: Normal appearance. He is not ill-appearing, toxic-appearing or diaphoretic.   Cardiovascular:      Rate and Rhythm: Tachycardia present.   Pulmonary:      Effort: Pulmonary effort is normal.   Abdominal:      General: Abdomen is flat. There is  no distension.      Palpations: Abdomen is soft.      Tenderness: There is no abdominal tenderness. There is no right CVA tenderness, left CVA tenderness or guarding.   Neurological:      General: No focal deficit present.      Mental Status: He is alert and oriented to person, place, and time.       Patient Data     Labs Ordered/Reviewed   BASIC METABOLIC PANEL - Abnormal; Notable for the following components:       Result Value    SODIUM 134 (*)     POTASSIUM 6.1 (*)     CO2 TOTAL 13 (*)     ANION GAP 21 (*)     BUN 88 (*)     CREATININE 11.51 (*)     ESTIMATED GFR - MALE 5 (*)     All other components within normal limits   CBC WITH DIFF - Abnormal; Notable for the following components:    WBC 12.4 (*)     RBC 3.87 (*)     HGB 12.4 (*)     HCT 36.6 (*)     NEUTROPHIL # 10.45 (*)     LYMPHOCYTE #  0.91 (*)     All other components within normal limits   PHOSPHORUS - Abnormal; Notable for the following components:    PHOSPHORUS 7.2 (*)     All other components within normal limits   HEPATITIS C ANTIBODY SCREEN WITH REFLEX TO HCV PCR - Normal   HIV1/HIV2 SCREEN, COMBINED ANTIGEN AND ANTIBODY - Normal   MAGNESIUM - Normal   CBC/DIFF    Narrative:     The following orders were created for panel order CBC/DIFF.  Procedure                               Abnormality         Status                     ---------                               -----------         ------                     CBC WITH OZDG[[id number]]                Abnormal            Final result                 Please view results for these tests on the individual orders.     No orders to display     Medical Decision Making        Medical Decision Making  Amount and/or Complexity of Data Reviewed  Labs: ordered.  ECG/medicine tests: ordered.    Risk  Prescription drug management.  Parenteral controlled substances.     Patient is a 61 year old male with a history of renal  stones who presents as a transfer from [place] for acute renal failure secondary to  right obstructing stone with severe hydronephrosis.  CT imaging at outside facility showed 6 mm left nonobstructing calculus and 9 mm right obstructing calculus.  Creatinine noted to be elevated to 12.6 with a BUN of 77.  Transferred to [facility] for [facility] evaluation.  Presentation, he is hypertensive to 185/76, tachycardic to 102, afebrile.  On exam, he has no CVA tenderness bilaterally.  Normal abdominal exam.      Repeat labs show a leukocytosis of 12.4, mild anemia of 12.4.  Electrolytes notable for a potassium of 6.1.  Calcium gluconate ordered.  EKG shows no findings concerning for hyperkalemia.  Creatinine remains elevated at 11.5. ABG shows a metabolic acidosis with a pH of 7.2, bicarb of 14.1, CO2 of 36.  Lactate elevated to 7.6.      Urology consulted for further evaluation of obstructing stone leading to   Acute renal failure.      Patient taken to the OR with Urology             Medications Administered in the ED   tamsulosin (FLOMAX) capsule (has no administration in time range)   NS bolus infusion 1,000 mL (1,000 mL Intravenous New Bag/New Syringe 02/08/22 1800)   calcium gluconate 100 mg/mL injection (1,000 mg Intravenous Given 02/08/22 1800)     Clinical Impression   Acute renal failure (ARF) (CMS HCC) (Primary)   Ureterolithiasis   Obstructive uropathy   Lactic acidosis   Hyperkalemia   Oliguria  Disposition: To the OR         I am scribing for, and in the presence of, Parke Poisson, MD, for services provided on 02/08/2022  Ellwood Handler, SCRIBE    // Ellwood Handler, SCRIBE  02/08/22 16:51    I personally performed the services described in this documentation, as scribed  in my presence, and it is both accurate  and complete.    Parke Poisson, MD

## 2022-02-08 NOTE — Anesthesia Transfer of Care (Signed)
ANESTHESIA TRANSFER OF CARE   Travis [name] is a 61 y.o. ,male,     had Procedure(s):  CYSTOSCOPY WITH URETERAL STENT INSERTION  performed  02/08/22   Primary Service:     Past Medical History:   Diagnosis Date    Diabetes mellitus, type 2 (CMS HCC)     Gout, unspecified     HTN (hypertension)     Kidney stones       Allergy History as of 02/08/22        No Known Allergies                  I completed my transfer of care / handoff to the receiving personnel during which we discussed:  Access, Airway, All key/critical aspects of case discussed, Analgesia, Antibiotics, Expectation of post procedure, Fluids/Product, Gave opportunity for questions and acknowledgement of understanding, Labs and PMHx      Post Location: PACU                        Additional Info:Pt transferred to PACU via surgical resident, OR Nurse, and CRNA. Pt hemodynamically stable, sats 100 percent on SFM. Report given to RN at bedside.                                      Last OR Temp: Temperature: 36 C (96.8 F)  ABG:  PCO2 (VENOUS)   Date Value Ref Range Status   02/08/2022 36 (L) 41 - 51 mm/Hg Final     PO2 (VENOUS)   Date Value Ref Range Status   02/08/2022 50 mm/Hg Final     Comment:     No ranges were established for venous pO2 as manufacturer does not recommend venous sample for this test.     POTASSIUM   Date Value Ref Range Status   02/08/2022 6.1 (HH) 3.5 - 5.1 mmol/L Final     CALCIUM   Date Value Ref Range Status   02/08/2022 9.1 8.6 - 10.3 mg/dL Final     Comment:     Gadolinium-containing contrast can interfere with calcium measurement.     Calculated P Axis   Date Value Ref Range Status   02/08/2022 61 degrees Incomplete     Calculated R Axis   Date Value Ref Range Status   02/08/2022 61 degrees Incomplete     Calculated T Axis   Date Value Ref Range Status   02/08/2022 37 degrees Incomplete     LACTATE   Date Value Ref Range Status   02/08/2022 7.6 (HH) <=1.9 mmol/L Final     BASE DEFICIT   Date Value Ref Range Status    02/08/2022 13.1 (H) 0.0 - 3.0 mmol/L Final     BICARBONATE (VENOUS)   Date Value Ref Range Status   02/08/2022 14.1 (L) 22.0 - 29.0 mmol/L Final     %FIO2 (VENOUS)   Date Value Ref Range Status   02/08/2022 21.0 % Final     Airway:* No LDAs found *  Blood pressure (!) 106/58, pulse 96, temperature 36 C (96.8 F), resp. rate 18, height 1.88 m (6\' 2" ), SpO2 98%.

## 2022-02-08 NOTE — Nurses Notes (Signed)
Pt arrived to unit. A&Ox4, wife at bedside. Call bell within reach.

## 2022-02-08 NOTE — ED Attending Note (Signed)
[facility] - Emergency Department  Primary Attending Note        Note begun by: Caesar Bookman, MD on 02/08/2022 at 4:43 PM  I was physically present and directly supervised this patient's care.  Patient seen and examined.  Resident / Trixie Dredge / NP history and exam reviewed.   Key elements in addition to and/or correction of that documentation are as follows:    Chief Complaint   Patient presents with    Acute Renal Failure     66mm renal calculi at [facility], elevated creatinine BUN.        HPI :    61 y.o. y.o. male with PMH of type 2 diabetes, gout, hypertension, nephrolithiasis with history of lithotripsy presents with chief complaint of concern for acute renal failure.  Patient went to [facility] ED today.  Has had 3 days of vomiting, nonbloody nonbilious.  Is also had severe decrease in his urine output over the last few days and had minimal UO with about 1 tbsp this morning over past week. No melena or hematochezia. Last normal urination was Saturday afternoon.  Reports he has had kidney stones many times in the past. He has had procedures in the past including lithotripsy. He reports burning and sharp pain starting one week ago. Constant. This past week has been having many episodes of NB NB emesis. Poor appetite. No fever or chills. No abd pain at this time. Never had hx of renal issues. No NSAID usage with exception of Toradol x 1 this week. Baseline renal function is normal to his knowledge.    Remainder of history can be found in the resident note.    Past Medical History:  Past Medical History:   Diagnosis Date    Diabetes mellitus, type 2 (CMS HCC)     Gout, unspecified     HLD (hyperlipidemia)     HTN (hypertension)     Kidney stones        Past Surgical History:  Past Surgical History:   Procedure Laterality Date    FINGER SURGERY      HX WISDOM TEETH EXTRACTION      LITHOTRIPSY           PE :   VS on presentation: Temperature: 36.6 C (97.8 F)  Heart Rate: (!) 102  Respiratory  Rate: 16  BP (Non-Invasive): (!) 185/76  SpO2: 95 %  Oxygen Therapy  SpO2: 95 %  HR-SpO2: (!) 106 bpm  O2 Delivery Source : None (Room Air)  Flow (L/min) (Oxygen Therapy): 6      I have examined the patient and agree with the resident's physical exam unless otherwise noted.    Briefly: Patient appears stable.  GCS 15. No gross cranial nerve deficits.  No facial asymmetry, ophthalmoplegia, dysarthria or aphasia.  No strength or sensory deficits in the extremities.  Intact distal pulses.  Heart and lung exam is unremarkable.  No increased work of breathing.  Abdomen soft,  non-distended, non-tender. No focal pain on abdominal exam.  No CVA tenderness.  Patient states it feels "good".  No ecchymoses.  Extremities appear atraumatic and non-edematous.   No erythema or asymmetry.  No rashes.  Appears psychiatrically appropriate.       MDM/Plan:       Imaging Results:  CT stone protocol completed today at [facility] showed bilateral ureteral calculi with moderate to severe hydronephrosis on the right    My interpretation of EKG:  Sinus rhythm with a rate of  102 beats per minute, normal axis, no ST segment elevation or depression.  T-waves are not prominent, PT, otherwise acute appearing.  Intervals are normal including.  Normal.  Normal P waves.  No sign wave.  No delta wave or evidence for Brugada syndrome    Labs and Interpretation:  Labs at outside hospital white count 12.5 otherwise unremarkable CBC, CMP sodium 132 potassium 5.4 gap 27 creatinine 12.6, troponin 0.05 initially.  Repeat labs here with potassium 6.1 BUN 88 creatinine 11.5 down from 12.6.  VBG 7.2/36/50/14 with lactate of 7.6.    On arrival, patient appears stable.  Afebrile and hemodynamically stable.  Alert and oriented x4, pleasant, and cooperative. Presents with acute renal failure as detailed further above.    On exam, patient has no focal deficits.  Mentating normally.  Labs and imaging from outside hospital reviewed.  Findings concerning for acute  mechanical obstruction from bilateral ureterolithiasis leading to renal failure.  Foley catheter inserted in the ED.  Labs collected.  Does have elevated potassium 6.1 and confirm acute renal failure here compared to outside labs.  EKG reviewed as above with sinus tachycardia, however no evidence for changes consistent with hyperkalemia.  Patient does require stabilization with calcium though in the setting of unclear initial timeline for decompression of his ureteral obstruction.  Vitals normal exception of low-grade tachycardia.  No urine available to test at this time as he is aneuric.  He denies fevers chills diaphoresis and other signs or symptoms of infectious etiology currently.  Will hold antibiotics at this time.  Calcium gluconate was given in the ED. No dextrose or insulin indicated at this time.      Patient was found to have metabolic acidosis likely secondary to acute renal failure and elevated lactate of 7.6.  Plan for bicarb bolus and drip, however, patient went to the OR prior to being was started this.  He denies NSAID use.  Labs reveal hypophosphatemia.  Magnesium normal.  Mild elevation of white count 12.4.  Hep C and HIV negative.  Urology was consulted emergently.  They evaluated patient shortly thereafter and took him to the OR for intervention.  Plan to admit likely to MICU thereafter.    The patient was fully informed and involved with history taking, evaluation, workup including labs/images, and plan.  Patient given IV fluid bolus and Flomax.  The patient's concerns and questions were addressed to the patient's satisfaction and they expressed agreement with the plan.    Medications given:  Medications Administered in the ED   tamsulosin (FLOMAX) capsule (has no administration in time range)   NS bolus infusion 1,000 mL (1,000 mL Intravenous New Bag/New Syringe 02/08/22 1800)   calcium gluconate 100 mg/mL injection (1,000 mg Intravenous Given 02/08/22 1800)       ED Course:     ED Course as  of 02/09/22 0024   Wed Feb 08, 2022   1745 Will likely require HCO3 pending VBG   1746 EKG ordered and pending   1746 Will treat with Ca Gluconate at this time pending EKG and consider insulin/dextrose.       Disposition: To the OR    Clinical Impression:     Clinical Impression   Acute renal failure (ARF) (CMS HCC) (Primary)   Ureterolithiasis   Obstructive uropathy   Lactic acidosis   Hyperkalemia   Oliguria       CRITICAL CARE: Total Critical Care time spent in the care of this patient at high risk of worsening  renal failure, need for dialysis, arrhythmia, etc.. Based on clinical evaluation, including initial evaluation and stabilization, coordination of care/mobilization of resources, review of data, re-examiniation, discussion with admitting and consulting services to arrange definitive care, discussion with patient and family members as appropriate regarding such care, documentation, and exclusive of any procedures performed, was 50 minutes.      This note was completed after the conclusion of care given the need for direct patient care at the time of service.

## 2022-02-08 NOTE — Nurses Notes (Signed)
Report was called for MICU bed 20. Pt stated he would call his wife once he got down to MICU to update her.     2328 - Pt was transported to ICU w/ cell phone. No other belongings were left.

## 2022-02-08 NOTE — ED Nurses Note (Addendum)
Report given to 5N OR. Urology at bedside to consent for procedure. Per urology ok to not place foley and not administer Flomax at this time.

## 2022-02-08 NOTE — ED Nurses Note (Signed)
Patient has not had any hiccups for about 20 minutes. Complains of indigestion and burning. Will notify Dr. Derek Mound

## 2022-02-08 NOTE — Ancillary Notes (Signed)
Abd series with pa chest completed   nc

## 2022-02-08 NOTE — OR Surgeon (Signed)
[facility]   DEPARTMENT OF UROLOGY   OPERATION SUMMARY     PATIENT NAME: Travis [name]  HOSPITAL NUMBER: [id number]   DATE OF SERVICE: 02/08/2022   DATE OF BIRTH: 02/06/1961     PREOPERATIVE DIAGNOSIS:   Bilateral obstructing stones  right 1 cm mid ureteral stone, 6 mm distal ureteral stone  AKI  creatinine 11.51  Hyperkalemia  K 6.1  Bilateral hydroureteronephrosis  Oliguria  PVR 40   Medical comorbidities including HTN, DM2    POSTOPERATIVE DIAGNOSIS: Same     NAME OF PROCEDURE:   1. Rigid cystourethroscopy  2. bilateral retrograde pyelography  3. bilateral 6 Fr x 28 cm Polaris ultra double J ureteral stent placement  4. Intraoperative fluoroscopy   5. MD foley placement    SURGEON(S): Colletta Maryland, MD  RESIDENT(S): Frazier Richards, MD    ANESTHESIA: General endotracheal anesthesia   ESTIMATED BLOOD LOSS: None  FLUIDS: Per anesthesia  COMPLICATIONS: None  TUBES AND DRAINS: None  IMPLANTS: bilateral 6 Fr x 28 cm Polaris ultra double J ureteral stent    INDICATIONS FOR PROCEDURE: 61 y.o. patient with bilateral obstructing stones presents for cystoscopy and ureteral stent placement.    FINDINGS:   1. Bilateral UOs in normal anatomic position.   2. No masses, lesions or stones in the bladder.   3. Appropriate placement of a bilateral 6 Fr x 28 cm Polaris ultra double J ureteral stent     DESCRIPTION OF PROCEDURE: The patient was consented preoperatively, and all risks and benefits of the procedure were explained. Patient was then brought back to operating room and set up for the procedure. The patient was placed on the operating table. General endotracheal anesthesia was administered. The patient was placed in the dorsal lithotomy position. Genitals were prepped and draped in the usual sterile fashion. Using a 22-French sheath and a 30-degree lens, we inserted a scope into the urethra and into the bladder under direct visualization. Once inside the bladder, panendoscopic views of the  bladder were obtained, including the anterior and lateral walls, floor, trigone and dome. Bilateral UOs were noted to be in normal anatomic position. No masses, lesions or stones were noted in the bladder. Our attention was drawn towards the bilateral ureteral orifice and a 5 Fr Pollack catheter was inserted through the scope and just inside the right UO and retrograde pyelography was performed using 10 cc of Conray 60. Findings above. A 0.035 straight tip Sensor wire was inserted through the scope and Pollack catheter into the ureteral orifice and up to the renal pelvis as demonstrated by intraoperative fluoroscopy. A right 6 Fr x 28 cm Polaris ultra double J ureteral stent was inserted over the wire, through the scope and up to the renal pelvis as demonstrated by intraoperative fluoroscopy. The same procedure was repeated on the left side, however, due to difficulty passing the [place] past the stone, a super-stiff wire was instead passed to the upper pole of the kidney. The 6 Fr x 28 cm double-J stent was passed into the renal pelvis as demonstrated by fluoroscopy. The wire was removed and appropriate placement was confirmed with fluoroscopy and direct visualization. Once deployed, fluoroscopy confirmed a good proximal coil. We visually confirmed a good distal coil. The scope was broken down, the bladder was emptied, and the procedure was terminated. The patient tolerated the procedure well, and there were no complications. Dr. Colletta Maryland was present and was available for assistance and participation throughout the entire procedure.  DISPOSITION: To PACU in stable condition. Recommend admission to medicine. The foley catheter should remain in place at this time. Urine culture was sent after stents were placed.      Venita Sheffield, MD  [facility]  Urology Resident  PGY2  [facility]    02/08/2022 19:43      I was present for all key and/or critical portions of the case and immediately  available at all times.    Additional Attending Documentation: none    Merian Capron, MD 02/12/2022, 12:58  Genitourinary Reconstructive Surgery  Assistant Professor - Department of Urology

## 2022-02-08 NOTE — Incoming ED Transfer Note (Signed)
Pt arrived to ED 18 via EMS from [place]. Pt states he has not been able to urinate in about "3 days". Pt c/o of flank pain and heartburn. Pt placed on monitor. ED physician at bedside. Call light in reach

## 2022-02-09 ENCOUNTER — Inpatient Hospital Stay (HOSPITAL_COMMUNITY): Payer: BC Managed Care – PPO

## 2022-02-09 DIAGNOSIS — Z960 Presence of urogenital implants: Secondary | ICD-10-CM

## 2022-02-09 DIAGNOSIS — N1330 Unspecified hydronephrosis: Secondary | ICD-10-CM

## 2022-02-09 DIAGNOSIS — N200 Calculus of kidney: Secondary | ICD-10-CM

## 2022-02-09 DIAGNOSIS — N281 Cyst of kidney, acquired: Secondary | ICD-10-CM

## 2022-02-09 DIAGNOSIS — R9431 Abnormal electrocardiogram [ECG] [EKG]: Secondary | ICD-10-CM

## 2022-02-09 LAB — BLOOD GAS W/ LACTATE REFLEX
%FIO2 (VENOUS): 21 %
%FIO2 (VENOUS): 21 %
%FIO2 (VENOUS): 21 %
%FIO2 (VENOUS): 21 %
%FIO2 (VENOUS): 28 %
%FIO2 (VENOUS): 28 %
%FIO2 (VENOUS): 28 %
%FIO2 (VENOUS): 28 %
BASE DEFICIT: 1.5 mmol/L (ref 0.0–3.0)
BASE DEFICIT: 11 mmol/L — ABNORMAL HIGH (ref 0.0–3.0)
BASE DEFICIT: 12.6 mmol/L — ABNORMAL HIGH (ref 0.0–3.0)
BASE DEFICIT: 14.9 mmol/L — ABNORMAL HIGH (ref 0.0–3.0)
BASE DEFICIT: 2.6 mmol/L (ref 0.0–3.0)
BASE DEFICIT: 6.4 mmol/L — ABNORMAL HIGH (ref 0.0–3.0)
BASE DEFICIT: 9.2 mmol/L — ABNORMAL HIGH (ref 0.0–3.0)
BASE DEFICIT: 9.6 mmol/L — ABNORMAL HIGH (ref 0.0–3.0)
BICARBONATE (VENOUS): 12.3 mmol/L — ABNORMAL LOW (ref 22.0–29.0)
BICARBONATE (VENOUS): 14.5 mmol/L — ABNORMAL LOW (ref 22.0–29.0)
BICARBONATE (VENOUS): 15.7 mmol/L — ABNORMAL LOW (ref 22.0–29.0)
BICARBONATE (VENOUS): 16.9 mmol/L — ABNORMAL LOW (ref 22.0–29.0)
BICARBONATE (VENOUS): 16.9 mmol/L — ABNORMAL LOW (ref 22.0–29.0)
BICARBONATE (VENOUS): 18.4 mmol/L — ABNORMAL LOW (ref 22.0–29.0)
BICARBONATE (VENOUS): 21.6 mmol/L — ABNORMAL LOW (ref 22.0–29.0)
BICARBONATE (VENOUS): 23.1 mmol/L (ref 22.0–29.0)
LACTATE: 2.6 mmol/L — ABNORMAL HIGH (ref <=1.9)
LACTATE: 4.1 mmol/L (ref <=1.9)
LACTATE: 4.4 mmol/L (ref <=1.9)
LACTATE: 5.4 mmol/L (ref <=1.9)
LACTATE: 5.4 mmol/L (ref <=1.9)
LACTATE: 6 mmol/L (ref <=1.9)
LACTATE: 6.1 mmol/L (ref <=1.9)
LACTATE: 7.3 mmol/L (ref <=1.9)
O2 SATURATION (VENOUS): 40.9 %
O2 SATURATION (VENOUS): 51 %
O2 SATURATION (VENOUS): 62.1 %
O2 SATURATION (VENOUS): 68.7 %
O2 SATURATION (VENOUS): 73.4 %
O2 SATURATION (VENOUS): 74.4 %
O2 SATURATION (VENOUS): 74.6 %
O2 SATURATION (VENOUS): 77.9 %
PCO2 (VENOUS): 35 mm/Hg — ABNORMAL LOW (ref 41–51)
PCO2 (VENOUS): 36 mm/Hg — ABNORMAL LOW (ref 41–51)
PCO2 (VENOUS): 36 mm/Hg — ABNORMAL LOW (ref 41–51)
PCO2 (VENOUS): 38 mm/Hg — ABNORMAL LOW (ref 41–51)
PCO2 (VENOUS): 38 mm/Hg — ABNORMAL LOW (ref 41–51)
PCO2 (VENOUS): 43 mm/Hg (ref 41–51)
PCO2 (VENOUS): 46 mm/Hg (ref 41–51)
PCO2 (VENOUS): 47 mm/Hg (ref 41–51)
PH (VENOUS): 7.15 — CL (ref 7.32–7.43)
PH (VENOUS): 7.21 — ABNORMAL LOW (ref 7.32–7.43)
PH (VENOUS): 7.25 — ABNORMAL LOW (ref 7.32–7.43)
PH (VENOUS): 7.26 — ABNORMAL LOW (ref 7.32–7.43)
PH (VENOUS): 7.27 — ABNORMAL LOW (ref 7.32–7.43)
PH (VENOUS): 7.28 — ABNORMAL LOW (ref 7.32–7.43)
PH (VENOUS): 7.32 (ref 7.32–7.43)
PH (VENOUS): 7.33 (ref 7.32–7.43)
PO2 (VENOUS): 27 mm/Hg
PO2 (VENOUS): 30 mm/Hg
PO2 (VENOUS): 42 mm/Hg
PO2 (VENOUS): 42 mm/Hg
PO2 (VENOUS): 43 mm/Hg
PO2 (VENOUS): 47 mm/Hg
PO2 (VENOUS): 49 mm/Hg
PO2 (VENOUS): 49 mm/Hg

## 2022-02-09 LAB — BASIC METABOLIC PANEL
ANION GAP: 19 mmol/L — ABNORMAL HIGH (ref 4–13)
ANION GAP: 20 mmol/L — ABNORMAL HIGH (ref 4–13)
ANION GAP: 18 mmol/L — ABNORMAL HIGH (ref 4–13)
ANION GAP: 18 mmol/L — ABNORMAL HIGH (ref 4–13)
ANION GAP: 18 mmol/L — ABNORMAL HIGH (ref 4–13)
ANION GAP: 20 mmol/L — ABNORMAL HIGH (ref 4–13)
ANION GAP: 17 mmol/L — ABNORMAL HIGH (ref 4–13)
BUN/CREA RATIO: 8 (ref 6–22)
BUN/CREA RATIO: 7 (ref 6–22)
BUN/CREA RATIO: 7 (ref 6–22)
BUN/CREA RATIO: 7 (ref 6–22)
BUN/CREA RATIO: 8 (ref 6–22)
BUN/CREA RATIO: 8 (ref 6–22)
BUN/CREA RATIO: 9 (ref 6–22)
BUN: 88 mg/dL — ABNORMAL HIGH (ref 8–25)
BUN: 84 mg/dL — ABNORMAL HIGH (ref 8–25)
BUN: 87 mg/dL — ABNORMAL HIGH (ref 8–25)
BUN: 86 mg/dL — ABNORMAL HIGH (ref 8–25)
BUN: 94 mg/dL — ABNORMAL HIGH (ref 8–25)
BUN: 98 mg/dL — ABNORMAL HIGH (ref 8–25)
BUN: 97 mg/dL — ABNORMAL HIGH (ref 8–25)
CALCIUM: 9 mg/dL (ref 8.6–10.3)
CALCIUM: 8.7 mg/dL (ref 8.6–10.3)
CALCIUM: 8.7 mg/dL (ref 8.6–10.3)
CALCIUM: 8.6 mg/dL (ref 8.6–10.3)
CALCIUM: 9.1 mg/dL (ref 8.6–10.3)
CALCIUM: 8.4 mg/dL — ABNORMAL LOW (ref 8.6–10.3)
CALCIUM: 8.2 mg/dL — ABNORMAL LOW (ref 8.6–10.3)
CHLORIDE: 99 mmol/L (ref 96–111)
CHLORIDE: 98 mmol/L (ref 96–111)
CHLORIDE: 97 mmol/L (ref 96–111)
CHLORIDE: 97 mmol/L (ref 96–111)
CHLORIDE: 96 mmol/L (ref 96–111)
CHLORIDE: 94 mmol/L — ABNORMAL LOW (ref 96–111)
CHLORIDE: 92 mmol/L — ABNORMAL LOW (ref 96–111)
CO2 TOTAL: 13 mmol/L — ABNORMAL LOW (ref 23–31)
CO2 TOTAL: 13 mmol/L — ABNORMAL LOW (ref 23–31)
CO2 TOTAL: 15 mmol/L — ABNORMAL LOW (ref 23–31)
CO2 TOTAL: 16 mmol/L — ABNORMAL LOW (ref 23–31)
CO2 TOTAL: 19 mmol/L — ABNORMAL LOW (ref 23–31)
CO2 TOTAL: 18 mmol/L — ABNORMAL LOW (ref 23–31)
CO2 TOTAL: 22 mmol/L — ABNORMAL LOW (ref 23–31)
CREATININE: 11.72 mg/dL — ABNORMAL HIGH (ref 0.75–1.35)
CREATININE: 11.71 mg/dL — ABNORMAL HIGH (ref 0.75–1.35)
CREATININE: 11.72 mg/dL — ABNORMAL HIGH (ref 0.75–1.35)
CREATININE: 11.58 mg/dL — ABNORMAL HIGH (ref 0.75–1.35)
CREATININE: 11.83 mg/dL — ABNORMAL HIGH (ref 0.75–1.35)
CREATININE: 11.98 mg/dL — ABNORMAL HIGH (ref 0.75–1.35)
CREATININE: 11.11 mg/dL — ABNORMAL HIGH (ref 0.75–1.35)
ESTIMATED GFR - MALE: 4 mL/min/BSA — ABNORMAL LOW (ref >=60)
ESTIMATED GFR - MALE: 4 mL/min/BSA — ABNORMAL LOW (ref >=60)
ESTIMATED GFR - MALE: 4 mL/min/BSA — ABNORMAL LOW (ref >=60)
ESTIMATED GFR - MALE: 5 mL/min/BSA — ABNORMAL LOW (ref >=60)
ESTIMATED GFR - MALE: 4 mL/min/BSA — ABNORMAL LOW (ref >=60)
ESTIMATED GFR - MALE: 4 mL/min/BSA — ABNORMAL LOW (ref >=60)
ESTIMATED GFR - MALE: 5 mL/min/BSA — ABNORMAL LOW (ref >=60)
GLUCOSE: 177 mg/dL — ABNORMAL HIGH (ref 65–125)
GLUCOSE: 184 mg/dL — ABNORMAL HIGH (ref 65–125)
GLUCOSE: 203 mg/dL — ABNORMAL HIGH (ref 65–125)
GLUCOSE: 197 mg/dL — ABNORMAL HIGH (ref 65–125)
GLUCOSE: 189 mg/dL — ABNORMAL HIGH (ref 65–125)
GLUCOSE: 149 mg/dL — ABNORMAL HIGH (ref 65–125)
GLUCOSE: 167 mg/dL — ABNORMAL HIGH (ref 65–125)
POTASSIUM: 6.9 mmol/L (ref 3.5–5.1)
POTASSIUM: 5.5 mmol/L — ABNORMAL HIGH (ref 3.5–5.1)
POTASSIUM: 5.8 mmol/L — ABNORMAL HIGH (ref 3.5–5.1)
POTASSIUM: 5.5 mmol/L — ABNORMAL HIGH (ref 3.5–5.1)
POTASSIUM: 5.5 mmol/L — ABNORMAL HIGH (ref 3.5–5.1)
POTASSIUM: 5.4 mmol/L — ABNORMAL HIGH (ref 3.5–5.1)
POTASSIUM: 4.5 mmol/L (ref 3.5–5.1)
SODIUM: 131 mmol/L — ABNORMAL LOW (ref 136–145)
SODIUM: 131 mmol/L — ABNORMAL LOW (ref 136–145)
SODIUM: 130 mmol/L — ABNORMAL LOW (ref 136–145)
SODIUM: 131 mmol/L — ABNORMAL LOW (ref 136–145)
SODIUM: 133 mmol/L — ABNORMAL LOW (ref 136–145)
SODIUM: 132 mmol/L — ABNORMAL LOW (ref 136–145)
SODIUM: 131 mmol/L — ABNORMAL LOW (ref 136–145)

## 2022-02-09 LAB — LACTIC ACID - FIRST REFLEX
LACTIC ACID: 5.6 mmol/L (ref 0.5–2.2)
LACTIC ACID: 4.6 mmol/L (ref 0.5–2.2)
LACTIC ACID: 3.9 mmol/L — ABNORMAL HIGH (ref 0.5–2.2)

## 2022-02-09 LAB — ECG 12-LEAD
Atrial Rate: 96 {beats}/min
Calculated P Axis: 65 degrees
Calculated R Axis: 62 degrees
Calculated T Axis: 70 degrees
PR Interval: 134 ms
QRS Duration: 92 ms
QT Interval: 320 ms
QTC Calculation: 404 ms
Ventricular rate: 96 {beats}/min

## 2022-02-09 LAB — POC BLOOD GLUCOSE (RESULTS)
GLUCOSE, POC: 156 mg/dl — ABNORMAL HIGH (ref 70–105)
GLUCOSE, POC: 170 mg/dl — ABNORMAL HIGH (ref 70–105)
GLUCOSE, POC: 177 mg/dl — ABNORMAL HIGH (ref 70–105)
GLUCOSE, POC: 205 mg/dl — ABNORMAL HIGH (ref 70–105)

## 2022-02-09 LAB — HGA1C (HEMOGLOBIN A1C WITH EST AVG GLUCOSE)
ESTIMATED AVERAGE GLUCOSE: 157 mg/dL
HEMOGLOBIN A1C: 7.1 % — ABNORMAL HIGH (ref 4.0–5.6)

## 2022-02-09 LAB — URINE CULTURE: URINE CULTURE: NO GROWTH

## 2022-02-09 MED ORDER — ALBUTEROL SULFATE CONCENTRATE 2.5 MG/0.5 ML SOLUTION FOR NEBULIZATION
10.0000 mg | INHALATION_SOLUTION | RESPIRATORY_TRACT | Status: DC
Start: 2022-02-09 — End: 2022-02-09
  Administered 2022-02-09: 10 mg via RESPIRATORY_TRACT

## 2022-02-09 MED ORDER — ALBUTEROL SULFATE 2.5 MG/3 ML (0.083 %) SOLUTION FOR NEBULIZATION
INHALATION_SOLUTION | RESPIRATORY_TRACT | Status: AC
Start: 2022-02-09 — End: 2022-02-10
  Filled 2022-02-09: qty 3

## 2022-02-09 MED ORDER — CALCIUM 200 MG (AS CALCIUM CARBONATE 500 MG) CHEWABLE TABLET
500.0000 mg | CHEWABLE_TABLET | Freq: Three times a day (TID) | ORAL | Status: DC | PRN
Start: 2022-02-09 — End: 2022-02-15
  Administered 2022-02-09: 500 mg via ORAL
  Filled 2022-02-09: qty 1

## 2022-02-09 MED ORDER — DEXTROSE 5 % IN WATER (D5W) INTRAVENOUS SOLUTION
INTRAVENOUS | Status: DC
Start: 2022-02-09 — End: 2022-02-10
  Filled 2022-02-09: qty 1000

## 2022-02-09 MED ORDER — ALBUTEROL SULFATE CONCENTRATE 2.5 MG/0.5 ML SOLUTION FOR NEBULIZATION
INHALATION_SOLUTION | RESPIRATORY_TRACT | Status: AC
Start: 2022-02-09 — End: 2022-02-10
  Filled 2022-02-09: qty 1

## 2022-02-09 MED ORDER — FUROSEMIDE 10 MG/ML INJECTION SOLUTION
100.0000 mg | Freq: Once | INTRAMUSCULAR | Status: DC
Start: 2022-02-09 — End: 2022-02-09

## 2022-02-09 MED ORDER — FAMOTIDINE 20 MG TABLET
20.0000 mg | ORAL_TABLET | Freq: Two times a day (BID) | ORAL | Status: DC
Start: 2022-02-09 — End: 2022-02-10
  Administered 2022-02-09 – 2022-02-10 (×2): 20 mg via ORAL
  Filled 2022-02-09 (×2): qty 1

## 2022-02-09 MED ORDER — FUROSEMIDE 10 MG/ML INJECTION SOLUTION
20.0000 mg | Freq: Every day | INTRAMUSCULAR | Status: DC
Start: 2022-02-09 — End: 2022-02-15
  Administered 2022-02-09 – 2022-02-10 (×2): 20 mg via INTRAVENOUS
  Filled 2022-02-09 (×2): qty 2

## 2022-02-09 MED ORDER — FUROSEMIDE 10 MG/ML INJECTION SOLUTION
100.0000 mg | Freq: Once | INTRAMUSCULAR | Status: AC
Start: 2022-02-09 — End: 2022-02-09
  Administered 2022-02-09: 100 mg via INTRAVENOUS
  Filled 2022-02-09: qty 10

## 2022-02-09 MED ORDER — DEXTROSE 50 % IN WATER (D50W) INTRAVENOUS SYRINGE
12.5000 g | INJECTION | Freq: Once | INTRAVENOUS | Status: AC
Start: 2022-02-09 — End: 2022-02-09
  Administered 2022-02-09: 25 mL via INTRAVENOUS
  Filled 2022-02-09: qty 50

## 2022-02-09 MED ORDER — INSULIN U-100 REGULAR HUMAN 100 UNIT/ML INJECTION SOLUTION
10.0000 [IU] | INTRAMUSCULAR | Status: AC
Start: 2022-02-09 — End: 2022-02-09
  Administered 2022-02-09: 10 [IU] via INTRAVENOUS
  Filled 2022-02-09: qty 30

## 2022-02-09 NOTE — Pharmacy (Signed)
Pharmacy Medication Reconciliation    Patient Name: Travis [name], Travis [name]  Date of Service: 02/09/2022  Date of Admission: 02/08/2022  Date of Birth: November 11, 1960  Length of Stay:   1 day   Service: MICU2    Transitions of Care:  Discharge Pharmacy services were discussed with the patient and the Meds to [facility] flowsheet and preferred pharmacy information were updated in EMR if applicable and able to assess with patient.    Information was collected from:  Patient, Pharmacy, and Caregiver  [facility]  [id number]   Darl Pikes (wife) [phone]      Does the Patient have Prescription Coverage? Yes    Clarified Prior to Admission Medications:  Prior to Admission medications    Medication Sig Taking Resumed Y/N (RPh) Comments   ascorbic acid, vitamin C, (VITAMIN C) 500 mg Oral Tablet Take 1 Tablet (500 mg total) by mouth Once a day Yes  N  OTC    cholecalciferol, vitamin D3, 50 mcg (2,000 unit) Oral Tablet Take 1 Tablet (2,000 Units total) by mouth Once a day Yes  N  OTC    Febuxostat (ULORIC) 80 mg Oral Tablet Take 1 Tablet (80 mg total) by mouth Once a day Yes  N  Filled 10/22 90d   glyBURIDE (DIABETA) 2.5 mg Oral Tablet Take 1 Tablet (2.5 mg total) by mouth Every morning with breakfast  Patient not taking: Reported on 02/09/2022 N    Filled 6/26 90d-pt reports he is not taking.   lisinopriL (PRINIVIL) 20 mg Oral Tablet Take 1 Tablet (20 mg total) by mouth Once a day Yes  N  Filled 9/29 90d   pioglitazone (ACTOS) 45 mg Oral Tablet Take 1 Tablet (45 mg total) by mouth Once a day Yes  N  Filled 9/29 90d   rosuvastatin (CRESTOR) 10 mg Oral Tablet Take 1 Tablet (10 mg total) by mouth Every evening Yes  N  Filled 9/29 90d    SITagliptin phos-metformin (JANUMET XR) 50-1,000 mg Oral Tab, Multiphasic Release 24 hr Take 1 Tablet by mouth Twice daily Yes  N  Filled 11/20 90d   SITagliptin phos-metformin (JANUMET) 50-1,000 mg Oral Tablet Take 1 Tablet by mouth Twice daily  Patient not taking: Reported on 02/09/2022 N    No fill hx-taking XR.      Did patient's home medication list require updates? Yes    Medications UPDATED on Prior to Admission Med List:  -increased pioglitazone from 15mg  to 45mg     Medications ADDED to Prior to Admission Med List:  -vitamin C  -Janumet XR    Allergies:    No Known Allergies       Did pharmacist make suggestions for medication reconciliation? No    Medications REMOVED from home medication list:  Glyburide and janumet    Pharmacist Recommendations:  None at this time     , [facility], BCCCP

## 2022-02-09 NOTE — Care Plan (Signed)
Patient remains on MICU as a 4-6-5 on a bicarb drip. 100mg  of lasix and 1 K+ cocktail given for hyperkalemia. Most recent potassium trending down to 5.5. Meds given per MAR.

## 2022-02-09 NOTE — Consults (Signed)
[facility]  DEPARTMENT OF UROLOGY  CONSULT FOLLOW UP NOTE      Impression:   61 y.o. male with:    Bilateral obstructing stones  right 1 cm mid ureteral stone, 6 mm distal ureteral stone  AKI  creatinine 11.51  Hyperkalemia  K 6.1  Bilateral hydroureteronephrosis  Oliguria  PVR 40   Medical comorbidities including HTN, DM2    POD1 s/p    1. Rigid cystourethroscopy  2. bilateral retrograde pyelography  3. bilateral 6 Fr x 28 cm Polaris ultra double J ureteral stent placement  4. Intraoperative fluoroscopy   5. MD foley placement    Recommendations:    Maintain foley catheter until patient out of intensive care unit and Cr has normalized  Hematuria is expected in the setting of indwelling ureteral stents and is not concerning so long as patient is emptying his bladder well  Ditropan 5 mg tid can be given for bladder spasms/foley-related discomfort  Continue to trend renal parameters to monitor for resolution of AKI/hyperkalemia  Follow results of intraoperative urine culture, antibiotic coverage for this per primary team  Will need to return for bilateral ureteroscopy with laser stone ablation in 2-4 weeks. Our schedulers have been messaged to arrange this, this will not appear in the discharge navigator  Remainder of care per primary team  Please call urology with questions or concerns      Subjective:    Resting comfortably this morning. States that pain is well-controlled. Has noticed some blood in his urine since surgery. Denies CP, SOB, fever, chills, other concerns.       PHYSICAL EXAMINATION:    Temperature: 36.7 C (98.1 F)  Heart Rate: (!) 105  BP (Non-Invasive): 138/69  Respiratory Rate: 15  SpO2: 96 %  Constitutional (e.g. vital signs, general appearance) - 61 y.o. male, appears comfortable  Eyes: Conjunctiva clear, sclera white   Ears, nose, mouth, and throat -  Trachea midline. Patient has two ears. Nose is midline, nostrils appear patent. Moist mucous membranes.     Cardiovascular - Pulse palpable.  Respiratory - Unlabored respiratory effort  Abdomen - Soft, non-distended, non-tender  Musculoskeletal - All four extremities present  Skin - Warm and dry  Neurological - CN II-XII grossly intact, A&O x 3  Psychiatric - Normal affect  Hematologic/lymphatic/immunologic - No petechiae.  Genitourinary - Foley catheter in place and draining blood-tinged urine      Labs Ordered/ Reviewed   Reviewed: Labs:  Lab Results for Last 24 Hours:   Results for orders placed or performed during the hospital encounter of 02/08/22 (from the past 24 hour(s))   BASIC METABOLIC PANEL   Result Value Ref Range    SODIUM 134 (L) 136 - 145 mmol/L    POTASSIUM 6.1 (HH) 3.5 - 5.1 mmol/L    CHLORIDE 100 96 - 111 mmol/L    CO2 TOTAL 13 (L) 23 - 31 mmol/L    ANION GAP 21 (H) 4 - 13 mmol/L    CALCIUM 9.1 8.6 - 10.3 mg/dL    GLUCOSE 211 65 - 941 mg/dL    BUN 88 (H) 8 - 25 mg/dL    CREATININE 74.08 (H) 0.75 - 1.35 mg/dL    BUN/CREA RATIO 8 6 - 22    ESTIMATED GFR - MALE 5 (L) >=60 mL/min/BSA   HEPATITIS C ANTIBODY SCREEN WITH REFLEX TO HCV PCR   Result Value Ref Range    HCV ANTIBODY QUALITATIVE Negative Negative   HIV1/HIV2 SCREEN, COMBINED  ANTIGEN AND ANTIBODY   Result Value Ref Range    HIV SCREEN, COMBINED ANTIGEN & ANTIBODY Negative Negative   CBC WITH DIFF   Result Value Ref Range    WBC 12.4 (H) 3.7 - 11.0 x10^3/uL    RBC 3.87 (L) 4.50 - 6.10 x10^6/uL    HGB 12.4 (L) 13.4 - 17.5 g/dL    HCT 43.5 (L) 68.6 - 52.0 %    MCV 94.6 78.0 - 100.0 fL    MCH 32.0 26.0 - 32.0 pg    MCHC 33.9 31.0 - 35.5 g/dL    RDW-CV 16.8 37.2 - 90.2 %    PLATELETS 283 150 - 400 x10^3/uL    MPV 10.0 8.7 - 12.5 fL    NEUTROPHIL % 84.1 %    LYMPHOCYTE % 7.3 %    MONOCYTE % 8.1 %    EOSINOPHIL % 0.1 %    BASOPHIL % 0.2 %    NEUTROPHIL # 10.45 (H) 1.50 - 7.70 x10^3/uL    LYMPHOCYTE # 0.91 (L) 1.00 - 4.80 x10^3/uL    MONOCYTE # 1.01 0.20 - 1.10 x10^3/uL    EOSINOPHIL # <0.10 <=0.50 x10^3/uL    BASOPHIL # <0.10 <=0.20 x10^3/uL    IMMATURE  GRANULOCYTE % 0.2 0.0 - 1.0 %    IMMATURE GRANULOCYTE # <0.10 <0.10 x10^3/uL   MAGNESIUM   Result Value Ref Range    MAGNESIUM 2.1 1.8 - 2.6 mg/dL   PHOSPHORUS   Result Value Ref Range    PHOSPHORUS 7.2 (H) 2.3 - 4.0 mg/dL   ECG 11-DBZM   Result Value Ref Range    Ventricular rate 102 BPM    Atrial Rate 102 BPM    PR Interval 124 ms    QRS Duration 84 ms    QT Interval 362 ms    QTC Calculation 471 ms    Calculated P Axis 61 degrees    Calculated R Axis 61 degrees    Calculated T Axis 37 degrees   BLOOD GAS W/ LACTATE REFLEX Venous   Result Value Ref Range    %FIO2 (VENOUS) 21.0 %    PH (VENOUS) 7.20 (L) 7.32 - 7.43    PCO2 (VENOUS) 36 (L) 41 - 51 mm/Hg    PO2 (VENOUS) 50 mm/Hg    BICARBONATE (VENOUS) 14.1 (L) 22.0 - 29.0 mmol/L    BASE DEFICIT 13.1 (H) 0.0 - 3.0 mmol/L    O2 SATURATION (VENOUS) 74.9 %    LACTATE 7.6 (HH) <=1.9 mmol/L   POC BLOOD GLUCOSE (RESULTS)   Result Value Ref Range    GLUCOSE, POC 110 (H) 70 - 105 mg/dl   POC BLOOD GLUCOSE (RESULTS)   Result Value Ref Range    GLUCOSE, POC 110 (H) 70 - 105 mg/dl   LACTIC ACID - FIRST REFLEX   Result Value Ref Range    LACTIC ACID 7.3 (HH) 0.5 - 2.2 mmol/L   BASIC METABOLIC PANEL   Result Value Ref Range    SODIUM 134 (L) 136 - 145 mmol/L    POTASSIUM 6.4 (HH) 3.5 - 5.1 mmol/L    CHLORIDE 100 96 - 111 mmol/L    CO2 TOTAL 12 (L) 23 - 31 mmol/L    ANION GAP 22 (H) 4 - 13 mmol/L    CALCIUM 9.4 8.6 - 10.3 mg/dL    GLUCOSE 080 65 - 223 mg/dL    BUN 84 (H) 8 - 25 mg/dL    CREATININE 36.12 (H) 0.75 - 1.35 mg/dL  BUN/CREA RATIO 7 6 - 22    ESTIMATED GFR - MALE 5 (L) >=60 mL/min/BSA   PHOSPHORUS   Result Value Ref Range    PHOSPHORUS 7.5 (H) 2.3 - 4.0 mg/dL   MAGNESIUM   Result Value Ref Range    MAGNESIUM 2.0 1.8 - 2.6 mg/dL   POC BLOOD GLUCOSE (RESULTS)   Result Value Ref Range    GLUCOSE, POC 129 (H) 70 - 105 mg/dl   BLOOD GAS W/ LACTATE REFLEX Venous   Result Value Ref Range    %FIO2 (VENOUS) 21.0 %    PH (VENOUS) 7.15 (LL) 7.32 - 7.43    PCO2 (VENOUS) 38 (L) 41  - 51 mm/Hg    PO2 (VENOUS) 43 mm/Hg    BICARBONATE (VENOUS) 12.3 (L) 22.0 - 29.0 mmol/L    BASE DEFICIT 14.9 (H) 0.0 - 3.0 mmol/L    O2 SATURATION (VENOUS) 62.1 %    LACTATE 7.3 (HH) <=1.9 mmol/L   BASIC METABOLIC PANEL   Result Value Ref Range    SODIUM 131 (L) 136 - 145 mmol/L    POTASSIUM 6.9 (HH) 3.5 - 5.1 mmol/L    CHLORIDE 99 96 - 111 mmol/L    CO2 TOTAL 13 (L) 23 - 31 mmol/L    ANION GAP 19 (H) 4 - 13 mmol/L    CALCIUM 9.0 8.6 - 10.3 mg/dL    GLUCOSE 158 (H) 65 - 125 mg/dL    BUN 88 (H) 8 - 25 mg/dL    CREATININE 30.94 (H) 0.75 - 1.35 mg/dL    BUN/CREA RATIO 8 6 - 22    ESTIMATED GFR - MALE 4 (L) >=60 mL/min/BSA   LACTIC ACID - FIRST REFLEX   Result Value Ref Range    LACTIC ACID 5.6 (HH) 0.5 - 2.2 mmol/L   BLOOD GAS W/ LACTATE REFLEX Venous   Result Value Ref Range    %FIO2 (VENOUS) 21.0 %    PH (VENOUS) 7.21 (L) 7.32 - 7.43    PCO2 (VENOUS) 36 (L) 41 - 51 mm/Hg    PO2 (VENOUS) 49 mm/Hg    BICARBONATE (VENOUS) 14.5 (L) 22.0 - 29.0 mmol/L    BASE DEFICIT 12.6 (H) 0.0 - 3.0 mmol/L    O2 SATURATION (VENOUS) 74.4 %    LACTATE 6.1 (HH) <=1.9 mmol/L   BASIC METABOLIC PANEL   Result Value Ref Range    SODIUM 131 (L) 136 - 145 mmol/L    POTASSIUM 5.5 (H) 3.5 - 5.1 mmol/L    CHLORIDE 98 96 - 111 mmol/L    CO2 TOTAL 13 (L) 23 - 31 mmol/L    ANION GAP 20 (H) 4 - 13 mmol/L    CALCIUM 8.7 8.6 - 10.3 mg/dL    GLUCOSE 076 (H) 65 - 125 mg/dL    BUN 84 (H) 8 - 25 mg/dL    CREATININE 80.88 (H) 0.75 - 1.35 mg/dL    BUN/CREA RATIO 7 6 - 22    ESTIMATED GFR - MALE 4 (L) >=60 mL/min/BSA   BLOOD GAS W/ LACTATE REFLEX Venous   Result Value Ref Range    %FIO2 (VENOUS) 28.0 %    PH (VENOUS) 7.25 (L) 7.32 - 7.43    PCO2 (VENOUS) 35 (L) 41 - 51 mm/Hg    PO2 (VENOUS) 47 mm/Hg    BICARBONATE (VENOUS) 15.7 (L) 22.0 - 29.0 mmol/L    BASE DEFICIT 11.0 (H) 0.0 - 3.0 mmol/L    O2 SATURATION (VENOUS) 74.6 %  LACTATE 6.0 (HH) <=1.9 mmol/L   POC BLOOD GLUCOSE (RESULTS)   Result Value Ref Range    GLUCOSE, POC 205 (H) 70 - 105 mg/dl   BLOOD GAS  W/ LACTATE REFLEX Venous   Result Value Ref Range    %FIO2 (VENOUS) 28.0 %    PH (VENOUS) 7.27 (L) 7.32 - 7.43    PCO2 (VENOUS) 36 (L) 41 - 51 mm/Hg    PO2 (VENOUS) 49 mm/Hg    BICARBONATE (VENOUS) 16.9 (L) 22.0 - 29.0 mmol/L    BASE DEFICIT 9.6 (H) 0.0 - 3.0 mmol/L    O2 SATURATION (VENOUS) 77.9 %    LACTATE 5.4 (HH) <=1.9 mmol/L        Radiology Tests Ordered/ Reviewed    No results found.        Frazier RichardsZachary Edgerton, MD   [facility]  Department of Urology   02/09/2022, 06:32        I saw and examined the patient.  I reviewed the resident's note.  I agree with the findings and plan of care as documented in the resident's note.  Any exceptions/additions are edited/noted.    Additional Attending Documentation: None    Earnie LarssonJohn T. Barnard, MD 02/12/2022, 12:21  Genitourinary Reconstructive Surgery  Assistant Professor - Department of Urology

## 2022-02-09 NOTE — Care Management Notes (Signed)
[facility] Initial Evaluation    Patient Name: Travis [name]  Date of Birth: Jan 27, 1961  Sex: male  Date/Time of Admission: 02/08/2022  4:36 PM  Room/Bed: 20/A  Payor: [place] / Plan: [place] BC[place] PPO / Product Type: PPO /   Primary Care Providers:  Charna Busman, NP, NP (General)    Pharmacy Info:   Preferred [address]    [place] [address]    Phone: [phone] Fax: [phone]    Hours: Not open 24 hours          Emergency Contact Info:   Extended Emergency Contact Information  Primary Emergency Contact: [facility]  Mobile Phone: [phone]  Relation: Wife  Preferred language: English  Interpreter needed? No    History:   Travis [name] is a 61 y.o., male, admitted with obstructive uropathy    Height/Weight: 188 cm (_0 ) / 111 kg (245 lb 2.4 oz)     LOS: 1 day   Admitting Diagnosis: Obstructive uropathy [N13.9]    Assessment:      02/09/22 1147   Assessment Details   Assessment Type Admission   Date of Care Management Update 02/09/22   Date of Next DCP Update 02/10/22   Readmission   Is this a readmission? No   Insurance Information/Type   Insurance type Commercial   Employment/Financial   Patient has Prescription Coverage?  Yes        Name of Insurance Coverage for Medications BC/BS   Financial Concerns none   Living Environment   Select an age group to open "lives with" row.  Adult   Lives With spouse   Living Arrangements house   Able to Return to Prior Arrangements yes   Home Safety   Home Assessment: Stairs in [place] Accessibility bed not on first floor;stairs to enter home;stairs within home   Custody and Legal Status   Do you have a court appointed guardian/conservator? No   Care Management Plan   Discharge Planning Status initial meeting   Projected Discharge Date 02/11/22   CM will evaluate for rehabilitation potential yes   Patient choice  offered to patient/family no   Form for patient choice reviewed/signed and on chart no   Patient aware of possible cost for ambulance transport?  No   Discharge Needs Assessment   Equipment Currently Used at Home none   Equipment Needed After Discharge other (see comments)  (TBD)   Discharge Facility/Level of Care Needs Home (Patient/Family Member/other)(code 1)   Transportation Available car;family or friend will provide   Referral Information   Admission Type inpatient   Address Verified verified-no changes   Arrived From acute hospital, other   [place]   [facility])   ADVANCE DIRECTIVES   Does the Patient have an Advance Directive? Yes, Patient Does Have Advance Directive for Healthcare Treatment   Copy of Advance Directives in Chart? No, Copy Requested From Patient   Patient Requests Assistance in Having Advance Directive Notarized. N/A   Patient Hand-Off   Clinical/Discharge Plan of Care Information Communicated to:  Medical Social Worker   Comments MSW Chis [place] Main Entrance   Number of Stairs, Main Entrance three   Stairs Within Home, Primary   Stairs, Within Home, [place] 12         Discharge Plan:  Home (Patient/Family Member/other) (code 1)  Pt admitted from the ED as a transfer from [facility]. Per chart review, POD#1 s/p bilateral double-J ureteral stent placement, MD foley placement. Pt is resting on room air via NC. Urology and Nephrology consulted/following. Foley remains in place, and pt is on a diabetic diet.     MSW [place] met with the pt at bedside to complete the initial assessment. Pt lives in a [place] house with his wife, Travis [name], who will provide transportation at d/c. Pt confirmed his insurance/prescription coverage, pharmacy of preference, and PCP. Pt follows regularly (every 6 months) with his PCP, Travis Barrows, NP, and the pt's last appointment was about 6 months prior to admission. Pt was independent prior to admission; pt was not active with  HH, and does not use any DME. Pt has previously completed advance directives, copy is not in Epic, pt's paper chart, or the [facility]; copy was requested from the pt. Lay caregiver was previously addressed by nursing staff. No additional needs identified at this time. Hand-off given to MSW Elmo Putt [phone]), who will continue to follow.     The patient will continue to be evaluated for developing discharge needs.     Case Manager: Elease Hashimoto, [place]  Phone: [phone]

## 2022-02-09 NOTE — Consults (Signed)
BRIEF NEPHROLOGY NOTE     PATIENT NAME: Travis [name]  CHART NUMBER: [id number]  DATE OF BIRTH: [birth year]  DATE OF SERVICE: 02/09/2022     Laboratory, hemodynamic and radiological data reviewed this evening -  Medication list reviewed.    This is a 61 y.o. year-old male with brief PMH including renal stones, DM2 (A1c 7.0 in June), HLD and HTN  who presented to [facility] as a transfer for mgmt of his obstructing kidney stones. Had presented to outside facility with 2 days of poor po intake, n/v. Workup at [facility] revealed a 9 mm right ureteral stone with severe HUN and resultant acute renal failure with a creatinine of 12.6, BUN 77, potassium of 5.4 and a bicarb of 12   Urology was consulted who urgently took the patient to the OR for rigid cystourethroscopy, retrograde pyelography and bilateral double-J ureteral stent placement.     Renal history none  Baseline BUN/Cr 18/1.11 with GFR 76 in June 2023  Meds: Bicarb gtt in D5W at 150cc/hr started at 11:23pm, Lokelma - first dose at 11:52pm, Lasix 100mg  IV at 1:56am    Home Med: ACE-i    Nephrology consulted for Hyperkalemia and post-obstructive AKI    Brief lab summary  Hemoglobin: Hgb 12.4  Significant electrolytes:K+ 6.9  Acid/Base Status: Bicarb 13, LA 5.6, VBG pH 7.21. Respiratory acidosis with pCO2 36  Current urine output:  410 mL since MN    Recommendations at this time   Recommend medical mgmt of hyperkalemia  Recommend to give Regular Insulin 10u IV along side of D50 amp.   Recommend to load with Lokelma and can continue with Lokelma per protocol till K+ reaches 4.5 and then can stop  Recommend to give Sodium bicarb 150 mEq in D5W @ 125cc/hr  Recommend to give calcium gluconate 1g IV to help stabilize cardiac membranes  Recommend to give high dose Albuterol nebulized treatments (hyperkalemia dosing)  Given that patient is producing urine - Can give Lasix IV  Ensure pt has a well positioned and functioning foley catheter  Recommend repeat Renal Imaging  ( ) to ensure hydro is resolving and stents are well positioned  Recommend q2h BMP and EKG checks. If K continues to not improve or if new EKG changes than please contact on call nephrology and we will consider RRT  No urgent need for HD at this time given that obstruction has resolved, pt is making urine and no arrhythmia noted    Thank you for the consult.  Please call with any questions.    Above recommendations were staffed with attending on call.     [place], MD12/21/2023 02:14  [facility]  Nephrology AKI Team  Nephrology Fellow PGY-4    Full note to follow    Attending Attestation     Chart reviewed.  Patient's renal condition was discussed in detailed and care plan was developed with the Fellow. I reviewed the Fellow's note. I agree with the findings and plan of care as documented in the fellow's note. Full note to follow and re-assessment of care plan based on response to medical management.     [facility], MD  02/09/2022, 08:05  Assistant Professor  Department of Medicine, Section of Nephrology   [facility]

## 2022-02-09 NOTE — Consults (Signed)
[place]    Date of Service: 02/09/2022  Travis [name]  Date of Admission: 02/08/2022  Consult requested by MICU2, Dr. Blima Singer    Reason for consult:   AKI    Assessment: 61 y.o. year-old male with PMH including renal stones, DM2, HLD and HTN who presented to [facility] as a transfer from [name] for obstructive uropathy and hyperkalemia. Was taken for cystoscopy with ureteral stent insertion 12/20. Nephrology consulted for AKI.    # AKI  # Hyperkalemia  # NAGMA  DDX: AKI 2/2 post obstructive nephropathy   Baseline Scr 1.11 mg/dL, Baseline GFR 76 since 07/2021  Current Cr 11.72 mg/dL from 11.4 mg/dL yesterday  UA none  Renal imaging: initial CT abdomen 12/20 showed moderate to severe hydronephrosis on the right. Follow up renal US showed mild right sided hydronephrosis  Lytes: K 6.9-->5.5, Na 131-->133  A/B: CO2 12--->19   PH 7.15-->7.28  CKD MB: Corrected calcium: 11.1, albumin 1.4  Anemia: Hgb 12.4  Cardiology:   Volume status  Euvolemic   UO non-oliguric 50-100 ml/hr  ID  BCx: none  UCx: none  Antibiotics: cefazolin 12/20, levofloxacin 12/20      Recommendations:  - Pt's GFR was 23m/min/1.73m2 in 07/2021. No labs since June. AKI is 2/2 post-obstructive uropathy. Patient reports that he was symptomatic for 3-days (anuria). No current evidence of post-obstructive diuresis. Urine output today 1.8L (charted as of 630pm). Will continue to monitor closely for need for RRT.   - Continue D5w + NaHCO3 1549m gtt @ 100 ml/hr.   -  C/W  lokelma 10g TID for now.   - Why is the reason for the patient's lactic acid. Check UA and urine culture to r/o associated infection.   - Agree with holding Ace-I in setting of AKI and Hyperkalemia.     Daily BMP, mag, phos  Avoid nephrotoxins, renally dose all medications  Avoid subclavian catheters with eGFR <30 mL/min  Strict I/O, monitor all urine output  Consider alternatives to radiocontrast procedures  when possible    HPI:  [age].o. year-old male with PMH including renal stones, DM2, HLD and HTN who [facility] a transfer from [name] obstructive uropathy and hyperkalemia. Pt "originally presented to our ED as a transfer from [name] he presented for 3 days of vomiting and inability to tolerate p.o. intake.  He also stated that time he had no urine output and also had small complaints of chest pain with shortness of breath associated with it". [facility] with severe HUN and resultant acute renal failure with a creatinine of 12.6, BUN 77, potassium of 5.4 and a bicarb of 12.  [name] to [facility].1 while the remainder of his renal parameters remained largely the same. Urology was consulted who urgently took the patient to the OR for rigid cystourethroscopy, retrograde pyelography and bilateral double-J ureteral stent placement". Pt was started medical management of hyperkalemia and acidosis. Pt resting in bed, reporting 20-25 total episodes of renal stones in the past but he had no issues urinating in the past. There were also no pain associated with renal calculi in the past. Pt states that he follows with Dr. [facility]) in [name] has completed 24 hour urine study which showed that his renal calculi were largely composed of calcium. Pt drinks about 3 gallons of water daily.  Pt reports that he has had nausea since the past Sunday and was not able to void for 3 days prior to presenting to ED. Nephrology consulted for AKI.     Objective:  Filed Vitals:    02/09/22 0500 02/09/22 0600 02/09/22 0700 02/09/22 0800   BP: (!) 111/57 138/69 114/74 129/65   Pulse: (!) 103 (!) 105 99 96   Resp: _0 Temp:       SpO2: 92% 96% 97% 98%     General: Appears no acute distress  Eyes: Conjunctiva clear, Sclera non-icteric.   HEENT: NC/AT  Lungs: equal expansion and effort     Cardiovascular: RRR, no edema  Abdomen: soft, non tender, non distended, BS active   Extremities: No deformities    Skin: Skin warm and dry, no rash    Neurologic: Grossly normal     Thank you for this consultation.  Will continue to follow.    I, independently of the faculty provider, spent a total of 50 minutes in direct/indirect care of this patient including initial evaluation, review of laboratory, radiology, diagnostic studies, review of medical record, order entry and coordination of care  Jacqulynn Cadet, APRN,NP-C, 02/09/2022, 08:49  Department of Medicine, section of Nephrology  Nephrology AKI team    Attending Attestation Note:    I saw and examined the patient. I reviewed NP WU's note. I agree with the findings and plan of care as documented in the NP's note. Any additions/changes were made accordingly.      Ophelia Charter MD   Assistant Professor of Medicine  Section of Nephrology  [facility]   02/09/2022 18:26

## 2022-02-09 NOTE — Progress Notes (Addendum)
Medical Intensive Care Unit  PROGRESS NOTE     Name: Travis [name], Travis [name], 61 y.o. male Date of service: 02/09/2022   Room: 20/A  LOS: 1    MRN: [id number] Attending: Clovis Pu, MD   Code Status: Full Code Admitted to MICU for:       SUBJECTIVE  INTERVAL EVENTS:     Yesterday, Travis [name] was admitted for obstructive uropathy, HAGMA, hyperkalemia. Overnight  urology completed rigid cystourethroscopy, retrograde  pyelography, bilateral double J ureteral stents.  . This morning he is resting in bed. Only complaint is hiccups which he has had for three days. Denies chest pain, palpitation, numbness and tingling in extremities.      OBJECTIVE:    VITAL SIGNS:  Temperature: 36.2 C (97.2 F) BP (Non-Invasive): (!) 150/70 Heart Rate: 89   Respiratory Rate: 15 SpO2: 99 % Weight: 111 kg (245 lb 2.4 oz)        INTAKE & OUTPUT:  Net Since Admission: 912.5    Intake/Output Summary (Last 24 hours) at 02/09/2022 1039  Last data filed at 02/09/2022 1000  Gross per 24 hour   Intake 3372.5 ml   Output 1045 ml   Net 2327.5 ml     Date of Last Bowel Movement:  (PTA)      VENTILATOR SETTINGS:  Not on Ventilator     LINES & DRAINS:   Patient Lines/Drains/Airways Status       Active Line / Dialysis Catheter / Dialysis Graft / Drain / Airway / Wound       Name Placement date Placement time Site Days    Peripheral IV Left Median Cubital  (antecubital fossa) 02/08/22  1015  -- 1    Peripheral IV Left Arm 02/08/22  1853  -- less than 1    Foley Catheter 02/08/22  1926  -- less than 1                      DAILY EXAMINATION:  General: Appears stated age, comfortable at rest  Eyes: EOMI  HENT: MM pink and moist  Lungs: CTA bilaterally without rales, rhonchi, and wheezes   Cardiovascular: RRR with no murmurs appreciated   Abdomen: Soft, non-distended, and non-tender to palpation; bowel sounds normal.  Genitourinary: Foley present, draining red-tinged urine   Extremities: No cyanosis or edema  Integumentary: Skin warm and dry, No  rashes  Neurologic: Grossly normal, alert and oriented x3       LABS  IMAGING  MICROBIOLOGY  PATHOLOGY:     I have reviewed all of the recent labs, imaging, microbiology and pathology.    CBC Differential   Recent Labs     02/08/22  0947 02/08/22  1700   WBC 12.5* 12.4*   HGB 13.6 12.4*   HCT 41.7 36.6*   PLTCNT 303 283    Recent Labs     02/08/22  0947 02/08/22  1700   PMNS 89.0 84.1   MONOCYTES 5.0 8.1   BASOPHILS 0.0  <0.10 0.2  <0.10   PMNABS 11.07* 10.45*   LYMPHSABS 0.68* 0.91*   MONOSABS 0.67 1.01   EOSABS <0.10 <0.10      BMP LFTs   Recent Labs     02/08/22  1700 02/08/22  2130 02/09/22  0107 02/09/22  0402 02/09/22  0600 02/09/22  0752   SODIUM 134* 134*   < > 131* 130* 131*   POTASSIUM 6.1* 6.4*   < > 5.5* 5.8* 5.5*   CHLORIDE  100 100   < > 98 97 97   CO2 13* 12*   < > 13* 15* 16*   BUN 88* 84*   < > 84* 87* 86*   CREATININE 11.51* 11.40*   < > 11.71* 11.72* 11.58*   ANIONGAP 21* 22*   < > 20* 18* 18*   BUNCRRATIO 8 7   < > 7 7 7    GFR 5* 5*   < > 4* 4* 5*   CALCIUM 9.1 9.4   < > 8.7 8.7 8.6   MAGNESIUM 2.1 2.0  --   --   --   --    PHOSPHORUS 7.2* 7.5*  --   --   --   --     < > = values in this interval not displayed.    No results found for this encounter   CoAgs Blood Gas:   No results found for this encounter Recent Labs     02/09/22  0600 02/09/22  0752 02/09/22  1021   FI02 28.0 28.0 28.0   PH 7.27* 7.26* 7.28*   PCO2 36* 38* 43   PO2 49 42 27   BICARBONATE 16.9* 16.9* 18.4*   BASEDEFICIT 9.6* 9.2* 6.4*       Cardiac Markers Lipid Panel   No results for input(s): "TROPONINI", "CKMB", "MBINDEX", "BNP" in the last 72 hours. No results found for this encounter   Urine Analysis Other Labs   Recent Labs     02/09/22  0752   GLUCOSE 197*    No results found for this encounter    Invalid input(s): "PRL"     Blood Cx N/A    02/11/22 Kidney 02/09/2022 : Mild right-sided hydronephrosis, bilateral nephrolithiasis, bilateral simple appearing renal cysts     ASSESSMENT:     Travis [name] is a 61 y.o. male with  a past medical hx of recurrent nephrolithiasis, HTN, T2DM, and gout, who presented to [facility] with obstructive uropathy, 3 days of emesis and decreased PO intake. He was admitted for management of HAGMA, hyperkalemia, obstructive uropathy s/p bilateral double ureteral stent placement 12/20.        Active Hospital Problems    Diagnosis    Primary Problem: Obstructive uropathy       PLAN:       Cardiovascular:    Hypertension   Holding home lisinopril 20 mg in setting of AKI and hyperkalemia   IV furosemide 20 daily   BP < 160 systolic       Renal:    Obstructive uropathy 2/2 bilateral obstructing stones with HUN s/p BL ureteral double J stent placement  Asymptomatic Hyperkalemia  HAGMA 2/2 lactic acidosis and elevated BUN  Hx of recurrent nephrolithiasis  Lokelma 10 mg TID until K reaches 4.5   IV furosemide 20, monitor urine output   Bicarb gtt at 100 mL/hr   VBG w/ lactate reflex Q4h: most recent 4.4 lactic acid, pH 7.28   Decompression with stent placement and foley draining urine expected to improve acidosis and hyperkalemia.   BMP Q4h trending   Nephrology following, appreciate recs   Continue bicarb drip, urinalysis w/ culture, IV lasix, albuterol hyperkalemia dosing   Urology following, appreciate recs   Can give ditropan 5 mg TID for bladder spasms/foley discomfort, return for bilateral ureteroscopy with loser stone ablation in 2-4 weeks   Strict I&Os   Urine culture pending       Endocrine:    Type 2 Diabetes  Mellitus   Conservative SSI   Renal Diabetic Diet     ___    Diet: Renal Diabetic Diet   Fluids: None  Analgesia: None  Sedation: None  DVT/PE Prophylaxis: Holding following urology procedure   Ulcer Prophylaxis: None   Glycemic Control: Conservative SSI Protocol  Bowel Regimen: None   Consults: Urology Nephrology.   Hardware (Lines, Drains, Foley, Tubes): PIV and Foley Catheter  Activity: Up w/ assistance and Up in chair TID w/ all meals  Therapy: PT/OT    Family  communication/Prognosis:  Family member at bedside. Updated by patient.     Disposition Planning - TBD; Likely Home discharge      Joylene John, MD - 02/09/2022   Transitional Year Resident, Department of Internal Medicine   [facility]      Critical Care Time:  Total critical care time spent in direct care of this patient at high risk based on presenting history/exam/and complaint, including initial evaluation and stabilization, review of data, re-examination, discussion with admitting and consulting services to arrange definitive care, and exclusive of any procedures performed, was 25 minutes.    61 yo critically male admitted with obstructive uropathy 2/2 bilateral obstructing stones, acute renal failure, high anion gap metabolic acidosis, and significant electrolyte abnormalities including hyperkalemia. Consulted and Urology and is s/p BL ureteral double J stent. Initiate don Lokelma, insulin +glucose with some improvement in potassium level. Continue close monitoring of metabolic panel.  Urine output increasing and creatinine down trending. Continue to monitor urine output and clearance. Can discontinue bicarbonate gtt as acidosis should improve now that obstructive nephropathy is addressed. If patient develops post-obstructive diuresis will need match output with balanced crystalloid. Urology will continue to follow. Nutrition and prophylaxis as above.       I saw and examined the patient.  I reviewed the resident's note.  I agree with the findings and plan of care as documented in the resident's note.  Any exceptions/additions are edited/noted.    Clovis Pu, MD

## 2022-02-10 ENCOUNTER — Other Ambulatory Visit: Payer: Self-pay

## 2022-02-10 DIAGNOSIS — G4734 Idiopathic sleep related nonobstructive alveolar hypoventilation: Secondary | ICD-10-CM

## 2022-02-10 DIAGNOSIS — N1330 Unspecified hydronephrosis: Secondary | ICD-10-CM

## 2022-02-10 DIAGNOSIS — N209 Urinary calculus, unspecified: Secondary | ICD-10-CM

## 2022-02-10 DIAGNOSIS — Z029 Encounter for administrative examinations, unspecified: Secondary | ICD-10-CM

## 2022-02-10 DIAGNOSIS — N139 Obstructive and reflux uropathy, unspecified: Secondary | ICD-10-CM

## 2022-02-10 LAB — BASIC METABOLIC PANEL
ANION GAP: 13 mmol/L (ref 4–13)
ANION GAP: 15 mmol/L — ABNORMAL HIGH (ref 4–13)
ANION GAP: 15 mmol/L — ABNORMAL HIGH (ref 4–13)
BUN/CREA RATIO: 8 (ref 6–22)
BUN/CREA RATIO: 9 (ref 6–22)
BUN/CREA RATIO: 9 (ref 6–22)
BUN: 104 mg/dL — ABNORMAL HIGH (ref 8–25)
BUN: 106 mg/dL — ABNORMAL HIGH (ref 8–25)
BUN: 97 mg/dL — ABNORMAL HIGH (ref 8–25)
CALCIUM: 7.9 mg/dL — ABNORMAL LOW (ref 8.6–10.3)
CALCIUM: 8.2 mg/dL — ABNORMAL LOW (ref 8.6–10.3)
CALCIUM: 8.7 mg/dL (ref 8.6–10.3)
CHLORIDE: 91 mmol/L — ABNORMAL LOW (ref 96–111)
CHLORIDE: 92 mmol/L — ABNORMAL LOW (ref 96–111)
CHLORIDE: 93 mmol/L — ABNORMAL LOW (ref 96–111)
CO2 TOTAL: 22 mmol/L — ABNORMAL LOW (ref 23–31)
CO2 TOTAL: 24 mmol/L (ref 23–31)
CO2 TOTAL: 25 mmol/L (ref 23–31)
CREATININE: 10.57 mg/dL — ABNORMAL HIGH (ref 0.75–1.35)
CREATININE: 12.12 mg/dL — ABNORMAL HIGH (ref 0.75–1.35)
CREATININE: 12.65 mg/dL — ABNORMAL HIGH (ref 0.75–1.35)
ESTIMATED GFR - MALE: 4 mL/min/BSA — ABNORMAL LOW (ref >=60)
ESTIMATED GFR - MALE: 4 mL/min/BSA — ABNORMAL LOW (ref >=60)
ESTIMATED GFR - MALE: 5 mL/min/BSA — ABNORMAL LOW (ref >=60)
GLUCOSE: 127 mg/dL — ABNORMAL HIGH (ref 65–125)
GLUCOSE: 159 mg/dL — ABNORMAL HIGH (ref 65–125)
GLUCOSE: 172 mg/dL — ABNORMAL HIGH (ref 65–125)
POTASSIUM: 4.2 mmol/L (ref 3.5–5.1)
POTASSIUM: 4.3 mmol/L (ref 3.5–5.1)
POTASSIUM: 4.5 mmol/L (ref 3.5–5.1)
SODIUM: 130 mmol/L — ABNORMAL LOW (ref 136–145)
SODIUM: 130 mmol/L — ABNORMAL LOW (ref 136–145)
SODIUM: 130 mmol/L — ABNORMAL LOW (ref 136–145)

## 2022-02-10 LAB — POC BLOOD GLUCOSE (RESULTS)
GLUCOSE, POC: 143 mg/dl — ABNORMAL HIGH (ref 70–105)
GLUCOSE, POC: 152 mg/dl — ABNORMAL HIGH (ref 70–105)
GLUCOSE, POC: 147 mg/dl — ABNORMAL HIGH (ref 70–105)
GLUCOSE, POC: 187 mg/dl — ABNORMAL HIGH (ref 70–105)

## 2022-02-10 LAB — BLOOD GAS W/ LACTATE REFLEX
%FIO2 (VENOUS): 21 %
%FIO2 (VENOUS): 28 %
%FIO2 (VENOUS): 28 %
BASE DEFICIT: 0.2 mmol/L (ref 0.0–3.0)
BASE DEFICIT: 2.2 mmol/L (ref 0.0–3.0)
BASE EXCESS: 1.5 mmol/L (ref 0.0–3.0)
BICARBONATE (VENOUS): 22.6 mmol/L (ref 22.0–29.0)
BICARBONATE (VENOUS): 24.6 mmol/L (ref 22.0–29.0)
BICARBONATE (VENOUS): 25.5 mmol/L (ref 22.0–29.0)
LACTATE: 1.4 mmol/L (ref <=1.9)
LACTATE: 1.7 mmol/L (ref <=1.9)
LACTATE: 2.4 mmol/L — ABNORMAL HIGH (ref <=1.9)
O2 SATURATION (VENOUS): 74 %
O2 SATURATION (VENOUS): 76.4 %
O2 SATURATION (VENOUS): 89.3 %
PCO2 (VENOUS): 44 mm/Hg (ref 41–51)
PCO2 (VENOUS): 47 mm/Hg (ref 41–51)
PCO2 (VENOUS): 49 mm/Hg (ref 41–51)
PH (VENOUS): 7.32 (ref 7.32–7.43)
PH (VENOUS): 7.36 (ref 7.32–7.43)
PH (VENOUS): 7.37 (ref 7.32–7.43)
PO2 (VENOUS): 43 mm/Hg
PO2 (VENOUS): 43 mm/Hg
PO2 (VENOUS): 59 mm/Hg

## 2022-02-10 LAB — CBC WITH DIFF
BASOPHIL #: <0.1 10*3/uL (ref <=0.20)
BASOPHIL %: 0.1 %
EOSINOPHIL #: <0.1 10*3/uL (ref <=0.50)
EOSINOPHIL %: 0.3 %
HCT: 33.1 % — ABNORMAL LOW (ref 38.9–52.0)
HGB: 11.3 g/dL — ABNORMAL LOW (ref 13.4–17.5)
IMMATURE GRANULOCYTE #: <0.1 10*3/uL (ref <0.10)
IMMATURE GRANULOCYTE %: 0.4 % (ref 0.0–1.0)
LYMPHOCYTE #: 0.95 10*3/uL — ABNORMAL LOW (ref 1.00–4.80)
LYMPHOCYTE %: 9.5 %
MCH: 31.1 pg (ref 26.0–32.0)
MCHC: 34.1 g/dL (ref 31.0–35.5)
MCV: 91.2 fL (ref 78.0–100.0)
MONOCYTE #: 1 10*3/uL (ref 0.20–1.10)
MONOCYTE %: 10 %
MPV: 10.2 fL (ref 8.7–12.5)
NEUTROPHIL #: 8 10*3/uL — ABNORMAL HIGH (ref 1.50–7.70)
NEUTROPHIL %: 79.7 %
PLATELETS: 227 10*3/uL (ref 150–400)
RBC: 3.63 10*6/uL — ABNORMAL LOW (ref 4.50–6.10)
RDW-CV: 13.8 % (ref 11.5–15.5)
WBC: 10 10*3/uL (ref 3.7–11.0)

## 2022-02-10 LAB — MAGNESIUM: MAGNESIUM: 2 mg/dL (ref 1.8–2.6)

## 2022-02-10 LAB — PHOSPHORUS: PHOSPHORUS: 7 mg/dL — ABNORMAL HIGH (ref 2.3–4.0)

## 2022-02-10 MED ORDER — HEPARIN (PORCINE) 5,000 UNIT/ML INJECTION SOLUTION
5000.0000 [IU] | Freq: Three times a day (TID) | INTRAMUSCULAR | Status: DC
Start: 2022-02-10 — End: 2022-02-15
  Administered 2022-02-10 – 2022-02-15 (×15): 5000 [IU] via SUBCUTANEOUS
  Filled 2022-02-10 (×15): qty 1

## 2022-02-10 MED ORDER — OXYBUTYNIN CHLORIDE 5 MG TABLET
5.0000 mg | ORAL_TABLET | Freq: Three times a day (TID) | ORAL | Status: DC
Start: 2022-02-10 — End: 2022-02-15
  Administered 2022-02-10 – 2022-02-15 (×15): 5 mg via ORAL
  Filled 2022-02-10 (×15): qty 1

## 2022-02-10 MED ORDER — CIPROFLOXACIN 500 MG TABLET
500.0000 mg | ORAL_TABLET | Freq: Every day | ORAL | 0 refills | Status: DC
Start: 2022-02-10 — End: 2022-03-20
  Filled 2022-02-10: qty 5, 5d supply, fill #0

## 2022-02-10 MED ORDER — ROSUVASTATIN 20 MG TABLET
10.0000 mg | ORAL_TABLET | Freq: Every evening | ORAL | Status: DC
Start: 2022-02-10 — End: 2022-02-15
  Administered 2022-02-10 – 2022-02-14 (×5): 10 mg via ORAL
  Filled 2022-02-10 (×5): qty 1

## 2022-02-10 MED ORDER — FAMOTIDINE 20 MG TABLET
20.0000 mg | ORAL_TABLET | Freq: Every day | ORAL | Status: DC
Start: 2022-02-11 — End: 2022-02-15
  Administered 2022-02-11 – 2022-02-15 (×5): 20 mg via ORAL
  Filled 2022-02-10 (×5): qty 1

## 2022-02-10 NOTE — Anesthesia Postprocedure Evaluation (Signed)
Anesthesia Post Op Evaluation    Patient: Travis [name]  Procedure(s):  CYSTOSCOPY WITH URETERAL STENT INSERTION    Last Vitals:Temperature: 36.6 C (97.9 F) (02/10/22 0400)  Heart Rate: 91 (02/10/22 0700)  BP (Non-Invasive): 104/68 (02/10/22 0700)  Respiratory Rate: (!) 23 (02/10/22 0700)  SpO2: 92 % (02/10/22 0700)    No notable events documented.    Patient is sufficiently recovered from the effects of anesthesia to participate in the evaluation and has returned to their pre-procedure level.  Patient location during evaluation: PACU       Patient participation: complete - patient participated  Level of consciousness: awake and alert and responsive to verbal stimuli    Pain management: adequate  Airway patency: patent    Anesthetic complications: no  Cardiovascular status: acceptable  Respiratory status: acceptable  Hydration status: acceptable  Patient post-procedure temperature: Pt Normothermic   PONV Status: Absent

## 2022-02-10 NOTE — Care Plan (Signed)
[place]  Physical Therapy Initial Evaluation    Patient Name: Travis [name]  Date of Birth: 14-Mar-1960  Height: Height: 188 cm (6\' 2" )  Weight: Weight: 110 kg (242 lb 1 oz)  Room/Bed: 20/A  Payor: BLUE CROSS BLUE [employer]IELD / Plan: [place] [employer] [employer] PPO / Product Type: PPO /     Assessment:      Mr Levens tolerated eval well, alert and cooperative. Follows command WNL. Moves all extremities WNL. Endorses feeling weaker and more unsteady than normal. Requesting FWW for at home and discussed w/ therapy. Performed bed mobility w/ HOB elevated, STS and ambulated hall w/ FWW and CGA. Pt slightly unsteady w/ gait initially but able to self correct. PT rec FWW upon d/c, safe to return home w/ assist.    Discharge Needs:    Equipment Recommendation: front wheeled walker      The patient presents with mobility limitations due to impaired balance, impaired strength, and impaired functional activity tolerance that significantly impair/prevent patient's ability to participate in mobility-related activities of daily living (MRADLs) including  ambulation and transfers in order to safely complete. This functional mobility deficit can be sufficiently resolved with the use of a front wheeled walker  in order to decrease the risk of falls, morbidity, and mortality in performance of these MRADLs.  Patient is able to safely use this assistive device.    Discharge Disposition: home with assist    JUSTIFICATION OF DISCHARGE RECOMMENDATION   Based on current diagnosis, functional performance prior to admission, and current functional performance, this patient requires continued PT services in home with assist in order to achieve significant functional improvements in these deficit areas: aerobic capacity/endurance, ergonomics and body mechanics, gait, locomotion, and balance, posture, ventilation and respiration/gas exchange.        Plan:   Current Intervention: balance training, bed mobility training,  gait training, patient/family education, ROM (range of motion), strengthening, stretching, transfer training  To provide physical therapy services minimum of 2x/week  for duration of until discharge.    The risks/benefits of therapy have been discussed with the patient/caregiver and he/she is in agreement with the established plan of care.       Subjective & Objective        02/10/22 [id number]   Therapist Pager   PT Assigned/ Pager # [phone] Endo [phone]   Rehab Session   Document Type evaluation   Total PT Minutes: 13   Patient Effort good   Symptoms Noted During/After Treatment none   General Information   Patient Profile Reviewed yes   Onset of Illness/Injury or Date of Surgery 02/08/22   Pertinent History of Current Functional Problem "61 y.o. y.o. male with PMH of type 2 diabetes, gout, hypertension, nephrolithiasis with history of lithotripsy presents with chief complaint of concern for acute renal failure.  Patient [facility] ED [facility].  Has had 3 days of vomiting, nonbloody nonbilious.  Is also had severe decrease in his urine output over the last few days and had minimal UO with about 1 tbsp this morning over past week. No melena or hematochezia. Last normal urination was Saturday afternoon.  Reports he has had kidney stones many times in the past. He has had procedures in the past including lithotripsy. He reports burning and sharp pain starting one week ago. Constant. This past week has been having many episodes of NB NB emesis. Poor appetite. No fever or chills. No abd pain at this time. Never had hx of  renal issues. No NSAID usage with exception of Toradol x 1 this week. Baseline renal function is normal to his knowledge."   Medical Lines Foley Catheter;PIV Line;Telemetry   Respiratory Status nasal cannula   Existing Precautions/Restrictions fall precautions;full code   Mutuality/Individual Preferences   Anxieties, Fears or Concerns none stated   Individualized Care Needs OOB w/ FWW and Ax1   Plan of Care  Reviewed With patient   Living Environment   Lives With spouse   Living Arrangements house   Home Assessment: Stairs in Home   Home Accessibility bed not on first floor;stairs to enter home;stairs within home   Living Environment Comment pt states he lives w/ spouse in 2 level house, 3STE and one flight within; walk in shower on second floor, half bath on first   Stairs Within Home, Primary   Stairs, Within Home, Primary 12   Home Main Entrance   Number of Stairs, Main Entrance three   Functional Level Prior   Ambulation 0 - independent   Transferring 0 - independent   Toileting 0 - independent   Bathing 0 - independent   Dressing 0 - independent   Eating 0 - independent   Communication 0 - understands/communicates without difficulty   Prior Functional Level Comment has shower seat in shower but is typically indep in all mobility   Self-Care   Current Activity Tolerance good   Equipment Currently Used at Home no   Equipment Currently Used at Home   (owns shower chair)   Pre Treatment Status   Pre Treatment Patient Status Patient supine in bed;Call light within reach;Telephone within reach;Patient safety alarm activated;Nurse approved session   Support Present Pre Treatment  None   Communication Pre Treatment  Nurse   Cognitive Assessment/Interventions   Behavior/Mood Observations behavior appropriate to situation, WNL/WFL   Orientation Status oriented x 4   Attention WNL/WFL   Follows Commands WNL   Vital Signs   Pre-Treatment Heart Rate (beats/min) 83   Post-treatment Heart Rate (beats/min) 95   Pre-Treatment Resp Rate (breaths/min) 14   Post-treatment Resp Rate (breaths/min) 18   Pre Treatment BP 105/56   Post Treatment BP 144/72   Pre SpO2 (%) 95   O2 Delivery Pre Treatment supplemental O2   Post SpO2 (%) 91   O2 Delivery Post Treatment supplemental O2   Vitals Comment VSS on 1.5L NC   Pain Assessment   Pre/Posttreatment Pain Comment no c/o pain   RUE Assessment   RUE Assessment WFL for stated baseline   LUE  Assessment   LUE Assessment WFL for stated baseline   RLE Assessment   RLE Assessment WFL for stated baseline   LLE Assessment   LLE Assessment WFL for stated baseline   Bed Mobility Assessment/Treatment   Bed Mobility, Assistive Device Head of Bed Elevated   Supine-Sit Independence modified independence   Sit to Supine, Independence modified independence   Impairments endurance;strength decreased   Transfer Assessment/Treatment   Sit-Stand Independence contact guard assist   Stand-Sit Independence contact guard assist   Sit-Stand-Sit, Assist Device walker, front wheeled   Transfer Impairments endurance;strength decreased   Gait Assessment/Treatment   Total Distance Ambulated 150   Independence  contact guard assist   Assistive Device  walker, front wheeled   Distance in Feet 150   Gait Speed slow but functional   Deviations  step length decreased;stride length decreased;cadence decreased   Safety Issues  step length decreased;balance decreased during turns   Impairments  balance impaired;endurance;strength  decreased   Comment pt slightly unsteady w/ gait initially; endorses feeling weaker than normal   Balance Skill Training   Comment FWW and CGA   Sitting Balance: Static good balance   Sitting, Dynamic (Balance) good balance   Sit-to-Stand Balance fair balance   Standing Balance: Static fair + balance   Standing Balance: Dynamic fair balance   Systems Impairment Contributing to Balance Disturbance musculoskeletal   Identified Impairments Contributing to Balance Disturbance decreased strength;other (see comments)  (decreased endurance)   Functional Endurance Training   Comment, Functional Endurance fair   Post Treatment Status   Post Treatment Patient Status Patient supine in bed;Call light within reach;Telephone within reach   Support Present Post Treatment  None   Communication Post Treatement Nurse   Communication Post Treatment Comment pt performance   Plan of Care Review   Plan Of Care Reviewed With patient    Basic Mobility Am-PAC/6Clicks Score (APPROVED Staff)   Turning in bed without bedrails 4   Lying on back to sitting on edge of flat bed 4   Moving to and from a bed to a chair 4   Standing up from chair 4   Walk in room 3   Climbing 3-5 steps with railing 3   6 Clicks Raw Score total 22   Standardized (t-scale) score 47.4   Patient Mobility Goal (JHHLM) 7- Walk 25 feet or more 3X/day   Exercise/Activity Level Performed 7- Walked 25 feet or more   Functional Impairment   Overall Functional Impairments/Problem List balance impaired;endurance;strength decreased   Physical Therapy Clinical Impression   Assessment Mr Salmela tolerated eval well, alert and cooperative. Follows command WNL. Moves all extremities WNL. Endorses feeling weaker and more unsteady than normal. Requesting FWW for at home and discussed w/ therapy. Performed bed mobility w/ HOB elevated, STS and ambulated hall w/ FWW and CGA. Pt slightly unsteady w/ gait initially but able to self correct. PT rec FWW upon d/c, safe to return home w/ assist.   Criteria for Skilled Therapeutic yes;meets criteria   Pathology/Pathophysiology Noted musculoskeletal;pulmonary   Impairments Found (describe specific impairments) aerobic capacity/endurance;ergonomics and body mechanics;gait, locomotion, and balance;posture;ventilation and respiration/gas exchange   Functional Limitations in Following  self-care;home management   Disability: Inability to Perform community/leisure   Rehab Potential good   Therapy Frequency minimum of 2x/week   Predicted Duration of Therapy Intervention (days/wks) until discharge   Anticipated Equipment Needs at Discharge (PT) front wheeled walker   Anticipated Discharge Disposition home with assist   Evaluation Complexity Justification   Patient History: Co-morbidity/factors that impact Plan of Care 1-2 that impact Plan of Care   Examination Components 3 or more Exam elements addressed   Presentation Stable: Uncomplicated, straight-forward,  problem focused   Clinical Decision Making Low complexity   Evaluation Complexity Low complexity   Care Plan Goals   PT Rehab Goals Bed Mobility Goal;Gait Training Goal;Transfer Training Goal   Bed Mobility Goal   Bed Mobility Goal, Date Established 02/10/22   Bed Mobility Goal, Time to Achieve by discharge   Bed Mobility Goal, Activity Type all bed mobility activities   Bed Mobility Goal, Independence Level independent   Gait Training  Goal, Distance to Achieve   Gait Training  Goal, Date Established 02/10/22   Gait Training  Goal, Time to Achieve by discharge   Gait Training  Goal, Independence Level modified independence   Gait Training  Goal, Assist Device least restricted assistive device   Gait Training  Goal,  Distance to Achieve 250   Transfer Training Goal   Transfer Training Goal, Date Established 02/10/22   Transfer Training Goal, Time to Achieve by discharge   Transfer Training Goal, Activity Type bed-to-chair/chair-to-bed;sit-to-stand/stand-to-sit   Transfer Training Goal, Independence Level modified independence   Transfer Training Goal, Assist Device least restrictrictive assistive device   Planned Therapy Interventions, PT Eval   Planned Therapy Interventions (PT) balance training;bed mobility training;gait training;patient/family education;ROM (range of motion);strengthening;stretching;transfer training   [place] Relationship/Rapport care explained       Therapist:   Ciarrah Rae L. Jarold Motto , DPT  Pager #: [phone]

## 2022-02-10 NOTE — Progress Notes (Signed)
Brief Medicine Transfer Note:    54M w/ PMH of recurrent nephrolithiasis, HTN, DMII, gout. He presented as a transfer from [place] for obstructive uropathy and hyperkalemia. Workup at [facility] revealed a 9 mm right ureteral stone with severe HUN and resultant acute renal failure with a creatinine of 12.6, BUN 77, potassium of 5.4 and a bicarb of 12. Upon arrival to [facility] his hyperkalemia had worsened to 6.1 while the remainder of his renal parameters remained largely the same. Urology was consulted who urgently took the patient to the OR for rigid cystourethroscopy, retrograde pyelography and bilateral double-J ureteral stent placement. Pt producing urine and is stable for transfer to [facility] on 12/22. Nephrology following given concern for post-obstructive AKI/ATN w/ significantly elevated Cr still approx 12.    General: NAD  CV: RRR  Resp: CTAB, breathing comfortably and satting high 90's on 2L  Abd: BS+, soft, no TTP  GU: Foley draining light pink urine  Ext: No edema    Obstructive acute kidney injury 2/2 bilateral obstructing stones with HUN s/p BL ureteral double J stent placement  Hx of recurrent nephrolithiasis  Asymptomatic hyperkalemia - resolved  HAGMA 2/2 lactic acidosis and elevated BUN - resolved  HTN  - urology consulted; appreciate recs   - s/p stent placement   - f/u in 2-4 weeks for b/l ureteroscopy   - oxybutynin  - nephrology consulted; appreciate recs  - s/p lokelma and bicarb gtt  - VBG stabilized, no longer trending  - avoid nephrotoxins   - home lisinopril held    HLD - home Crestor  DMII - hold hold oral antiglycemics, on conservative SSI  GERD - Pepcid, Tums     Abbie Sons, MD 02/10/2022 15:49  PGY-2, Internal Medicine    I saw and examined the patient.  I reviewed Dr. Nicky Pugh note.  I agree with the findings and plan of care as documented in Dr. Nicky Pugh note.  Any exceptions/additions are edited/noted.    Asencion Noble, MD 17:11 02/10/2022  Hospitalist  [facility]

## 2022-02-10 NOTE — Consults (Signed)
[facility]  CONSULT FOLLOW UP NOTE      Impression:   61 y.o. male with:    Bilateral obstructing stones  right 1 cm mid ureteral stone, 6 mm distal ureteral stone  AKI  creatinine 11.51  Hyperkalemia  K 6.1  Bilateral hydroureteronephrosis  Oliguria  PVR 40   Medical comorbidities including HTN, DM2    POD2 s/p    1. Rigid cystourethroscopy  2. bilateral retrograde pyelography  3. bilateral 6 Fr x 28 cm Polaris ultra double J ureteral stent placement  4. Intraoperative fluoroscopy   5. MD foley placement    Recommendations:    Maintain foley catheter until patient out of intensive care unit after that time foely may be removed per primary.  After Foley catheter is removed please obtain serial postvoid residuals in order to assess the patient is voiding appropriately.  Hematuria is expected in the setting of indwelling ureteral stents and is not concerning so long as patient is emptying his bladder well  Ditropan 5 mg tid can be given if bladder spasms/foley-related discomfort  Continue to trend renal parameters to monitor for resolution of AKI/hyperkalemia  Follow results of intraoperative urine culture, antibiotic coverage for this per primary team  Will need to return for bilateral ureteroscopy with laser stone ablation in 2-4 weeks. Our schedulers have been messaged to arrange this, this will not appear in the discharge navigator  Remainder of care per primary team  Please call urology with questions or concerns      Subjective:    Resting comfortably this morning. States that pain is well-controlled. Denies CP, SOB, fever, chills, flank pain.  He reports that in the middle of his back he is having some pain because he has not been getting out of bed much.      PHYSICAL EXAMINATION:    Temperature: 36.6 C (97.9 F)  Heart Rate: 80  BP (Non-Invasive): 124/65  Respiratory Rate: (!) 8  SpO2: 94 %  Constitutional (e.g. vital signs, general appearance) - 61 y.o.  male, appears comfortable  Eyes: Conjunctiva clear, sclera white   Ears, nose, mouth, and throat -  Trachea midline. Patient has two ears. Nose is midline, nostrils appear patent. Moist mucous membranes.    Cardiovascular - Pulse palpable.  Respiratory - Unlabored respiratory effort  Abdomen - Soft, non-distended, non-tender  Musculoskeletal - All four extremities present  Skin - Warm and dry  Neurological - CN II-XII grossly intact, A&O x 3  Psychiatric - Normal affect  Hematologic/lymphatic/immunologic - No petechiae.  Genitourinary - Foley catheter in place and draining blood-tinged urine      Labs Ordered/ Reviewed   Reviewed: Labs:  Lab Results for Last 24 Hours:   Results for orders placed or performed during the hospital encounter of 02/08/22 (from the past 24 hour(s))   BASIC METABOLIC PANEL   Result Value Ref Range    SODIUM 131 (L) 136 - 145 mmol/L    POTASSIUM 5.5 (H) 3.5 - 5.1 mmol/L    CHLORIDE 97 96 - 111 mmol/L    CO2 TOTAL 16 (L) 23 - 31 mmol/L    ANION GAP 18 (H) 4 - 13 mmol/L    CALCIUM 8.6 8.6 - 10.3 mg/dL    GLUCOSE 197 (H) 65 - 125 mg/dL    BUN 86 (H) 8 - 25 mg/dL    CREATININE 11.58 (H) 0.75 - 1.35 mg/dL    BUN/CREA RATIO 7 6 - 22  ESTIMATED GFR - MALE 5 (L) >=60 mL/min/BSA   BLOOD GAS W/ LACTATE REFLEX Venous   Result Value Ref Range    %FIO2 (VENOUS) 28.0 %    PH (VENOUS) 7.26 (L) 7.32 - 7.43    PCO2 (VENOUS) 38 (L) 41 - 51 mm/Hg    PO2 (VENOUS) 42 mm/Hg    BICARBONATE (VENOUS) 16.9 (L) 22.0 - 29.0 mmol/L    BASE DEFICIT 9.2 (H) 0.0 - 3.0 mmol/L    O2 SATURATION (VENOUS) 68.7 %    LACTATE 5.4 (HH) <=1.9 mmol/L   LACTIC ACID - FIRST REFLEX   Result Value Ref Range    LACTIC ACID 4.6 (HH) 0.5 - 2.2 mmol/L   BASIC METABOLIC PANEL   Result Value Ref Range    SODIUM 133 (L) 136 - 145 mmol/L    POTASSIUM 5.5 (H) 3.5 - 5.1 mmol/L    CHLORIDE 96 96 - 111 mmol/L    CO2 TOTAL 19 (L) 23 - 31 mmol/L    ANION GAP 18 (H) 4 - 13 mmol/L    CALCIUM 9.1 8.6 - 10.3 mg/dL    GLUCOSE 189 (H) 65 - 125 mg/dL     BUN 94 (H) 8 - 25 mg/dL    CREATININE 11.83 (H) 0.75 - 1.35 mg/dL    BUN/CREA RATIO 8 6 - 22    ESTIMATED GFR - MALE 4 (L) >=60 mL/min/BSA   LACTIC ACID - FIRST REFLEX   Result Value Ref Range    LACTIC ACID 3.9 (H) 0.5 - 2.2 mmol/L   BLOOD GAS W/ LACTATE REFLEX Venous   Result Value Ref Range    %FIO2 (VENOUS) 28.0 %    PH (VENOUS) 7.28 (L) 7.32 - 7.43    PCO2 (VENOUS) 43 41 - 51 mm/Hg    PO2 (VENOUS) 27 mm/Hg    BICARBONATE (VENOUS) 18.4 (L) 22.0 - 29.0 mmol/L    BASE DEFICIT 6.4 (H) 0.0 - 3.0 mmol/L    O2 SATURATION (VENOUS) 40.9 %    LACTATE 4.4 (HH) <=1.9 mmol/L   POC BLOOD GLUCOSE (RESULTS)   Result Value Ref Range    GLUCOSE, POC 177 (H) 70 - 105 mg/dl   BLOOD GAS W/ LACTATE REFLEX Venous   Result Value Ref Range    %FIO2 (VENOUS) 21.0 %    PH (VENOUS) 7.32 7.32 - 7.43    PCO2 (VENOUS) 46 41 - 51 mm/Hg    PO2 (VENOUS) 30 mm/Hg    BICARBONATE (VENOUS) 21.6 (L) 22.0 - 29.0 mmol/L    BASE DEFICIT 2.6 0.0 - 3.0 mmol/L    O2 SATURATION (VENOUS) 51.0 %    LACTATE 4.1 (HH) <=1.9 mmol/L   BASIC METABOLIC PANEL   Result Value Ref Range    SODIUM 132 (L) 136 - 145 mmol/L    POTASSIUM 5.4 (H) 3.5 - 5.1 mmol/L    CHLORIDE 94 (L) 96 - 111 mmol/L    CO2 TOTAL 18 (L) 23 - 31 mmol/L    ANION GAP 20 (H) 4 - 13 mmol/L    CALCIUM 8.4 (L) 8.6 - 10.3 mg/dL    GLUCOSE 149 (H) 65 - 125 mg/dL    BUN 98 (H) 8 - 25 mg/dL    CREATININE 11.98 (H) 0.75 - 1.35 mg/dL    BUN/CREA RATIO 8 6 - 22    ESTIMATED GFR - MALE 4 (L) >=60 mL/min/BSA   POC BLOOD GLUCOSE (RESULTS)   Result Value Ref Range    GLUCOSE, POC 156 (H)  70 - 105 mg/dl   BASIC METABOLIC PANEL   Result Value Ref Range    SODIUM 131 (L) 136 - 145 mmol/L    POTASSIUM 4.5 3.5 - 5.1 mmol/L    CHLORIDE 92 (L) 96 - 111 mmol/L    CO2 TOTAL 22 (L) 23 - 31 mmol/L    ANION GAP 17 (H) 4 - 13 mmol/L    CALCIUM 8.2 (L) 8.6 - 10.3 mg/dL    GLUCOSE 416 (H) 65 - 125 mg/dL    BUN 97 (H) 8 - 25 mg/dL    CREATININE 60.63 (H) 0.75 - 1.35 mg/dL    BUN/CREA RATIO 9 6 - 22    ESTIMATED GFR - MALE 5  (L) >=60 mL/min/BSA   BLOOD GAS W/ LACTATE REFLEX Venous   Result Value Ref Range    %FIO2 (VENOUS) 21.0 %    PH (VENOUS) 7.33 7.32 - 7.43    PCO2 (VENOUS) 47 41 - 51 mm/Hg    PO2 (VENOUS) 42 mm/Hg    BICARBONATE (VENOUS) 23.1 22.0 - 29.0 mmol/L    BASE DEFICIT 1.5 0.0 - 3.0 mmol/L    O2 SATURATION (VENOUS) 73.4 %    LACTATE 2.6 (H) <=1.9 mmol/L   POC BLOOD GLUCOSE (RESULTS)   Result Value Ref Range    GLUCOSE, POC 170 (H) 70 - 105 mg/dl   BASIC METABOLIC PANEL   Result Value Ref Range    SODIUM 130 (L) 136 - 145 mmol/L    POTASSIUM 4.2 3.5 - 5.1 mmol/L    CHLORIDE 93 (L) 96 - 111 mmol/L    CO2 TOTAL 22 (L) 23 - 31 mmol/L    ANION GAP 15 (H) 4 - 13 mmol/L    CALCIUM 7.9 (L) 8.6 - 10.3 mg/dL    GLUCOSE 016 (H) 65 - 125 mg/dL    BUN 97 (H) 8 - 25 mg/dL    CREATININE 01.09 (H) 0.75 - 1.35 mg/dL    BUN/CREA RATIO 9 6 - 22    ESTIMATED GFR - MALE 5 (L) >=60 mL/min/BSA   BLOOD GAS W/ LACTATE REFLEX Venous   Result Value Ref Range    %FIO2 (VENOUS) 28.0 %    PH (VENOUS) 7.37 7.32 - 7.43    PCO2 (VENOUS) 44 41 - 51 mm/Hg    PO2 (VENOUS) 59 mm/Hg    BICARBONATE (VENOUS) 24.6 22.0 - 29.0 mmol/L    BASE DEFICIT 0.2 0.0 - 3.0 mmol/L    O2 SATURATION (VENOUS) 89.3 %    LACTATE 1.7 <=1.9 mmol/L   BASIC METABOLIC PANEL   Result Value Ref Range    SODIUM 130 (L) 136 - 145 mmol/L    POTASSIUM 4.5 3.5 - 5.1 mmol/L    CHLORIDE 92 (L) 96 - 111 mmol/L    CO2 TOTAL 25 23 - 31 mmol/L    ANION GAP 13 4 - 13 mmol/L    CALCIUM 8.7 8.6 - 10.3 mg/dL    GLUCOSE 323 (H) 65 - 125 mg/dL    BUN 557 (H) 8 - 25 mg/dL    CREATININE 32.20 (H) 0.75 - 1.35 mg/dL    BUN/CREA RATIO 9 6 - 22    ESTIMATED GFR - MALE 4 (L) >=60 mL/min/BSA   BLOOD GAS W/ LACTATE REFLEX Venous   Result Value Ref Range    %FIO2 (VENOUS) 28.0 %    PH (VENOUS) 7.36 7.32 - 7.43    PCO2 (VENOUS) 49 41 - 51 mm/Hg    PO2 (  VENOUS) 43 mm/Hg    BICARBONATE (VENOUS) 25.5 22.0 - 29.0 mmol/L    BASE EXCESS 1.5 0.0 - 3.0 mmol/L    O2 SATURATION (VENOUS) 76.4 %    LACTATE 1.4 <=1.9 mmol/L    MAGNESIUM   Result Value Ref Range    MAGNESIUM 2.0 1.8 - 2.6 mg/dL   PHOSPHORUS   Result Value Ref Range    PHOSPHORUS 7.0 (H) 2.3 - 4.0 mg/dL   CBC WITH DIFF   Result Value Ref Range    WBC 10.0 3.7 - 11.0 x10^3/uL    RBC 3.63 (L) 4.50 - 6.10 x10^6/uL    HGB 11.3 (L) 13.4 - 17.5 g/dL    HCT 33.1 (L) 38.9 - 52.0 %    MCV 91.2 78.0 - 100.0 fL    MCH 31.1 26.0 - 32.0 pg    MCHC 34.1 31.0 - 35.5 g/dL    RDW-CV 13.8 11.5 - 15.5 %    PLATELETS 227 150 - 400 x10^3/uL    MPV 10.2 8.7 - 12.5 fL    NEUTROPHIL % 79.7 %    LYMPHOCYTE % 9.5 %    MONOCYTE % 10.0 %    EOSINOPHIL % 0.3 %    BASOPHIL % 0.1 %    NEUTROPHIL # 8.00 (H) 1.50 - 7.70 x10^3/uL    LYMPHOCYTE # 0.95 (L) 1.00 - 4.80 x10^3/uL    MONOCYTE # 1.00 0.20 - 1.10 x10^3/uL    EOSINOPHIL # <0.10 <=0.50 x10^3/uL    BASOPHIL # <0.10 <=0.20 x10^3/uL    IMMATURE GRANULOCYTE % 0.4 0.0 - 1.0 %    IMMATURE GRANULOCYTE # <0.10 <0.10 x10^3/uL   POC BLOOD GLUCOSE (RESULTS)   Result Value Ref Range    GLUCOSE, POC 143 (H) 70 - 105 mg/dl        Radiology Tests Ordered/ Reviewed    US KIDNEY                      Result Date: 02/09/2022  Impression 1. Mild right-sided hydronephrosis with small portion of the ureteral stent visualized in the renal pelvis. 2. Left-sided ureteral stent is not visualized. 3. Bilateral nephrolithiasis. 4. Bilateral simple appearing renal cysts.    CT RENAL CALCULI SERIES WO IV CONTRAST    Result Date: 02/08/2022  Impression BILATERAL URETERAL CALCULI WITH MODERATE TO SEVERE HYDRONEPHROSIS ON THE RIGHT. One or more dose reduction techniques were used (e.g., Automated exposure control, adjustment of the mA and/or kV according to patient size, use of iterative reconstruction technique). Radiologist location ID: [id number]     XR ABD FLAT AND UPRIGHT SERIES (W PA CHEST)    Result Date: 02/08/2022  Impression POTENTIAL LARGE RIGHT URETERAL CALCULUS. Radiologist location ID: [id number]          Karma Greaser, MD PGY-4  02/10/22 06:30   [facility] Department of Urology      I saw and examined the patient.  I reviewed the resident's note.  I agree with the findings and plan of care as documented in the resident's note.  Any exceptions/additions are edited/noted.    Additional Attending Documentation: None    Merian Capron, MD 02/12/2022, 12:21  Genitourinary Reconstructive Surgery  Assistant Professor - Department of Urology

## 2022-02-10 NOTE — Progress Notes (Signed)
Medical Intensive Care Unit  PROGRESS NOTE     Name: Travis [name], Travis [name], 61 y.o. male Date of service: 02/10/2022   Room: 20/A  LOS: 2    MRN: [id number] Attending: Eula Flax, MD   Code Status: Full Code Admitted to MICU for:       SUBJECTIVE  INTERVAL EVENTS:     Yesterday, Travis [name] Overnight: bicarb drip was discontinued. This morning: patient no longer complaining of hiccups; relieved with famotidine. Appetite full; ate all of breakfast. Has no complaints.      OBJECTIVE:    VITAL SIGNS:  Temperature: 36.8 C (98.2 F) BP (Non-Invasive): 104/68 Heart Rate: 91   Respiratory Rate: (!) 23 SpO2: 92 % Weight: 110 kg (242 lb 1 oz)        INTAKE & OUTPUT:  Net Since Admission: 912.5    Intake/Output Summary (Last 24 hours) at 02/10/2022 [id number]  Last data filed at 02/10/2022 0800  Gross per 24 hour   Intake 2285 ml   Output 2440 ml   Net -155 ml     Date of Last Bowel Movement:  (PTA)      VENTILATOR SETTINGS:  Not on Ventilator     LINES & DRAINS:   Patient Lines/Drains/Airways Status       Active Line / Dialysis Catheter / Dialysis Graft / Drain / Airway / Wound       Name Placement date Placement time Site Days    Peripheral IV Left Median Cubital  (antecubital fossa) 02/08/22  1015  -- 1    Peripheral IV Left Arm 02/08/22  1853  -- 1    Foley Catheter 02/08/22  1926  -- 1                      DAILY EXAMINATION:  General: Appears stated age, comfortable at rest  Eyes: EOMI  HENT: MM pink and moist  Lungs: CTA bilaterally without rales, rhonchi, and wheezes   Cardiovascular: RRR with no murmurs appreciated   Abdomen: Soft, non-distended, and non-tender to palpation; bowel sounds diminished   Genitourinary: Foley present, draining red-tinged urine   Extremities: No cyanosis or edema  Integumentary: Skin warm and dry, No rashes  Neurologic: Grossly normal, alert and oriented x3       LABS  IMAGING  MICROBIOLOGY  PATHOLOGY:     I have reviewed all of the recent labs, imaging, microbiology and  pathology.    CBC Differential   Recent Labs     02/08/22  0947 02/08/22  1700 02/10/22  0420   WBC 12.5* 12.4* 10.0   HGB 13.6 12.4* 11.3*   HCT 41.7 36.6* 33.1*   PLTCNT 303 283 227    Recent Labs     02/08/22  0947 02/08/22  1700 02/10/22  0420   PMNS 89.0 84.1 79.7   MONOCYTES 5.0 8.1 10.0   BASOPHILS 0.0  <0.10 0.2  <0.10 0.1  <0.10   PMNABS 11.07* 10.45* 8.00*   LYMPHSABS 0.68* 0.91* 0.95*   MONOSABS 0.67 1.01 1.00   EOSABS <0.10 <0.10 <0.10      BMP LFTs   Recent Labs     02/09/22  2016 02/09/22  2357 02/10/22  0420   SODIUM 131* 130* 130*   POTASSIUM 4.5 4.2 4.5   CHLORIDE 92* 93* 92*   CO2 22* 22* 25   BUN 97* 97* 104*   CREATININE 11.11* 10.57* 12.12*   ANIONGAP 17* 15* 13   BUNCRRATIO  9 9 9    GFR 5* 5* 4*   CALCIUM 8.2* 7.9* 8.7   MAGNESIUM  --   --  2.0   PHOSPHORUS  --   --  7.0*    No results found for this encounter   CoAgs Blood Gas:   No results found for this encounter Recent Labs     02/09/22  1609 02/09/22  2017 02/09/22  2357 02/10/22  0420   FI02 21.0 21.0 28.0 28.0   PH 7.32 7.33 7.37 7.36   PCO2 46 47 44 49   PO2 30 42 59 43   BICARBONATE 21.6* 23.1 24.6 25.5   BASEEXCESS  --   --   --  1.5   BASEDEFICIT 2.6 1.5 0.2  --        Cardiac Markers Lipid Panel   No results for input(s): "TROPONINI", "CKMB", "MBINDEX", "BNP" in the last 72 hours. No results found for this encounter   Urine Analysis Other Labs   Recent Labs     02/10/22  0420   GLUCOSE 127*    No results found for this encounter    Invalid input(s): "PRL"     Blood Cx N/A    02/12/22 Kidney 02/09/2022 : Mild right-sided hydronephrosis, bilateral nephrolithiasis, bilateral simple appearing renal cysts     ASSESSMENT:     Travis [name] is a 61 y.o. male with a past medical hx of recurrent nephrolithiasis, HTN, T2DM, and gout, who presented to [facility] with obstructive uropathy, 3 days of emesis and decreased PO intake. He was admitted for management of HAGMA, hyperkalemia, obstructive uropathy s/p bilateral double  ureteral stent placement 12/20.        Active Hospital Problems    Diagnosis    Primary Problem: Obstructive uropathy       PLAN:       Cardiovascular:    Hypertension   Holding home lisinopril 20 mg in setting of AKI and hyperkalemia   Discontinuing furosemide per nephrology's recs   BP < 160 systolic       Renal:    Obstructive uropathy 2/2 bilateral obstructing stones with HUN s/p BL ureteral double J stent placement  Asymptomatic Hyperkalemia  HAGMA 2/2 lactic acidosis and elevated BUN  Hx of recurrent nephrolithiasis  Lokelma discontinued as potassium has normalized to 4.5   Bicarb gtt discontinued overnight   VBG w/ lactate reflex Q4h: most recent 2.4 lactic acid, pH 7.32   Decompression with stent placement and foley draining urine expected to improve acidosis and hyperkalemia.   Nephrology following, appreciate recs   Urology following, appreciate recs   Can give ditropan 5 mg TID for bladder spasms/foley discomfort, return for bilateral ureteroscopy with loser stone ablation in 2-4 weeks   Strict I&Os   Urine culture, no growth to date       Endocrine:    Type 2 Diabetes Mellitus   Conservative SSI   Renal Diabetic Diet     ___    Diet: Renal Diabetic Diet   Fluids: None  Analgesia: None  Sedation: None  DVT/PE Prophylaxis: Restarted 12/22; heparin px   Ulcer Prophylaxis: None   Glycemic Control: Conservative SSI Protocol  Bowel Regimen: None   Consults: Urology Nephrology.   Hardware (Lines, Drains, Foley, Tubes): PIV and Foley Catheter  Activity: Up w/ assistance and Up in chair TID w/ all meals  Therapy: PT/OT      Disposition Planning - TBD; Likely Home discharge  Vanessa Durham, MD - 02/10/2022   Transitional Year Resident, Department of Internal Medicine   [facility]      ----------------------------------------------------------------------------------------------------------------------  Attending Note:   DOS: 02/10/2022    I have seen and evaluated the patient with  the medical staff, all labs, and imaging studies reviewed.   I agree with the findings including assessment and plan as documented in the resident's note.  Any exceptions/additions are noted .    Case was discussed in the multidiscipliary rounds.     Total Critical care Time Spent (not including procedures): 30 min     Lavone Weisel Al-Jaroushi MD  Associate Professor, [place]

## 2022-02-10 NOTE — Consults (Signed)
[facility]  FOLLOW UP NEPHROLOGY CONSULT    Date of Service: 02/10/2022  [name]  Date of Admission: 02/08/2022  Consult requested by MICU2, Dr. Blima Singer    Reason for consult:   AKI    Assessment: 61 y.o. year-old male with PMH including renal stones, DM2, HLD and HTN who presented to [facility] as a transfer from [facility] for obstructive uropathy and hyperkalemia. Was taken for cystoscopy with ureteral stent insertion 12/20. Nephrology consulted for AKI.    # AKI  # Hyperkalemia (resolved)  # NAGMA( resolved)  DDX: AKI 2/2 post obstructive nephropathy   Baseline Scr 1.11 mg/dL, Baseline GFR 76 since 07/2021  Current Cr 12.12 mg/dL from 11.98 mg/dL yesterday  UA none  Renal imaging: initial CT abdomen 12/20 showed moderate to severe hydronephrosis on the right. Follow up renal US showed mild right sided hydronephrosis  Lytes: K 6.9-->5.5-->4.5, Na 131-->133-->130  A/B: CO2 12--->19-->25   PH 7.15-->7.28-->7.32  CKD MB: Corrected calcium: 11.4, albumin 1.4  Anemia: Hgb 11.3  Cardiology:   Volume status  Euvolemic   UO non-oliguric 2.4L 12/21  ID  BCx: none  UCx: none  Antibiotics: cefazolin 12/20, levofloxacin 12/20      Recommendations:  Hyperkalemia and NAGMA have resolved. Robust Urine output, however BUN creatinine level went up overnight.     Stop diuretics and let him eat and drink.   OP OSA Assessment is needed. + Hypoxia at night when sleeping only.   Daily BMP, mag, phos  Avoid nephrotoxins, renally dose all medications  Avoid subclavian catheters with eGFR <30 mL/min  Strict I/O, monitor all urine output  Consider alternatives to radiocontrast procedures when possible    Subjective:  Pt sitting in bed reporting no n/v or other uremic symptoms. Stating that he only requires O2 at night. He said that he has never been tested for OSA. Pt reports good appetite. He drinks 3-4 pitchers of fluids a day.    Objective:  BP 137/73   Pulse 96   Temp 36.8 C  (98.2 F)   Resp 20   Ht 1.88 m (_0 )   Wt 110 kg (242 lb 1 oz)   SpO2 95%   BMI 31.08 kg/m     General: Appears no acute distress  Eyes: Conjunctiva clear, Sclera non-icteric.   HEENT: NC/AT  Lungs: equal expansion and effort    Cardiovascular: RRR, no edema  Abdomen: soft, non tender, non distended, BS active   Extremities: No deformities    Skin: Skin warm and dry, no rash    Neurologic: Grossly normal     Thank you for this consultation.  Will continue to follow.    I, independently of the faculty provider, spent a total of 28 minutes in direct/indirect care of this patient including initial evaluation, review of laboratory, radiology, diagnostic studies, review of medical record, order entry and coordination of care  Jacqulynn Cadet, APRN,NP-C, 02/10/2022, 08:49  Department of Medicine, section of Nephrology  Nephrology AKI team    Attending Attestation Note:    I saw and examined the patient. I reviewed NP Wu's note.  I agree with the findings and plan of care as documented in the NP's note. Any additions/changes were made accordingly.      Ophelia Charter MD   Assistant Professor of Medicine  Section of Nephrology  [facility]   02/10/2022 19:26

## 2022-02-10 NOTE — Care Plan (Signed)
[facility]  Occupational Therapy Initial Evaluation    Patient Name: Travis [name]  Date of Birth: [birth year]  Height: Height: 188 cm (_0 )  Weight: Weight: 110 kg (242 lb 1 oz)  Room/Bed: 20/A  Payor: BLUE CROSS BLUE SHIELD / Plan: [place] [employer] PPO / Product Type: PPO /     Assessment:   Travis [name] performed well during OT evaluation this date. Pt admitted for management of HAGMA, hyperkalemia, obstructive uropathy s/p bilateral double ureteral stent placement on 12/20. From a functional standpoint, pt appears to be functioning below his stated baseline regarding safety and IND with mobility and ADL routine secondary to mild balance deficits, decreased flexibility, O2 requirement, and some post-op discomfort, though was able to complete functional bed mobility, seated UB ADLs, STS transfer, and ambulation of 150 feet with use of FWW, SPV-CGA provided for balance, steadying though no overt LOB noted throughout. From OT perspective, with continued activity and functional progress, recommend d/c home with assist and use of FWW once medically appropriate. OT will continue to follow.      Discharge Needs:   Equipment Recommendation: front wheeled walker    The patient presents with mobility limitations due to impaired balance, impaired strength, and impaired functional activity tolerance that significantly impair/prevent patient's ability to participate in mobility-related activities of daily living (MRADLs) including  ambulation and transfers in order to safely complete, toileting, bathing, food preparation, safely entering/exiting the home, in reasonable time. This functional mobility deficit can be sufficiently resolved with the use of a front wheeled walker in order to decrease the risk of falls, morbidity, and mortality in performance of these MRADLs.  Patient is able to safely use this assistive device.    Discharge Disposition: home with assist    Plan:   Current  Intervention: ADL retraining, IADL retraining, balance training, bed mobility training, endurance training, strengthening, therapeutic exercise, transfer training    To provide Occupational therapy services 1x/day, minimum of 2x/week, until discharge, until goals are met.     The risks/benefits of therapy have been discussed with the patient/caregiver and he/she is in agreement with the established plan of care.       Subjective & Objective        02/10/22 [id number]   Therapist Pager   OT Assigned/ Pager # Travis [name] [phone]   Rehab Session   Document Type evaluation   Total OT Minutes: 13   Patient Effort excellent   Symptoms Noted During/After Treatment none   General Information   Patient Profile Reviewed yes   Onset of Illness/Injury or Date of Surgery 02/08/22   Pertinent History of Current Functional Problem Travis [name] is a 61 y.o. male with a past medical hx of recurrent nephrolithiasis, HTN, T2DM, and gout, who presented to [name] with obstructive uropathy, 3 days of emesis and decreased PO intake. He was admitted for management of HAGMA, hyperkalemia, obstructive uropathy s/p bilateral double ureteral stent placement 12/20.   Medical Lines PIV Line;Telemetry;Foley Catheter   Respiratory Status nasal cannula  (removed for OOB activity, re-applied at conclusion of session)   Existing Precautions/Restrictions full code;fall precautions   Pre Treatment Status   Pre Treatment Patient Status Patient supine in bed;Call light within reach;Telephone within reach;Nurse approved session   Support Present Pre Treatment  None   Communication Pre Treatment  Nurse   Communication Pre Treatment Comment Cleared for participation per bedside RN. Seen with professional assist of PT.   Mutuality/Individual Preferences  Individualized Care Needs Goes by Travis [name]; OOB with FWW and assist x1; encourage upright position in chair TID for all meals and independence with ADLs   Patient-Specific Goals (Include Timeframe) To go  home by Sunday   Plan of Care Reviewed With patient   Living Environment   Lives With spouse   Living Arrangements house   Home Assessment: Stairs in [place] Accessibility stairs to enter home;stairs within home;tub/shower is not walk in;bed and bath are not on the first floor   Transportation Available car;family or friend will provide   Living Environment Comment Pt lives with his wife in a 2-story home with 3 STE and a flight of 12 stairs with B HRs present to access his bedroom and full bathroom. 1/2 bath on the main level. He is able to use a walk-in shower with built in seat for bathing tasks.   Home Main Entrance   Number of Stairs, Main Entrance three   Stairs Within Home, Primary   Stairs, Within Home, Primary 12   Stair Railings, Within Home, Primary railings on both sides of stairs   Functional Level Prior   Ambulation 0 - independent   Transferring 0 - independent   Toileting 0 - independent   Bathing 0 - independent   Dressing 0 - independent   Eating 0 - independent   Communication 0 - understands/communicates without difficulty   Prior Functional Level Comment Pt is generally fully independent with all aspects of functional mobility, ADLs, IADLs without use of DME. Can use a walk-in shower with built in seat for bathing tasks. 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   Self-Care   Equipment Currently Used at Home no   Vital Signs   Pre-Treatment Heart Rate (beats/min) 83   Post-treatment Heart Rate (beats/min) 95   Pre-Treatment Resp Rate (breaths/min) 14   Post-treatment Resp Rate (breaths/min) 18   Pre Treatment BP 105/56   Post Treatment BP 144/72   Pre SpO2 (%) 95   O2 Delivery Pre Treatment supplemental O2   Post SpO2 (%) 91   O2 Delivery Post Treatment supplemental O2   Vitals Comment 1.5L O2 removed for OOB activity, O2 sats in high 80's following ambulation and re-applied O2.   Pain Assessment   Pre/Posttreatment Pain Comment Pt with no formal c/o pain throughout time of session  this date.   Coping/Psychosocial   Observed Emotional State calm;cooperative;pleasant   Verbalized Emotional State acceptance   Coping/Psychosocial Response Interventions   Plan Of Care Reviewed With patient   Cognitive Assessment/Interventions   Behavior/Mood Observations alert;behavior appropriate to situation, WNL/WFL;cooperative   Orientation Status oriented x 4   Attention WNL/WFL   Follows Commands WNL;WFL   RUE Assessment   RUE Assessment WFL for stated baseline   LUE Assessment   LUE Assessment WFL for stated baseline   RLE Assessment   RLE Assessment WFL for stated baseline   LLE Assessment   LLE Assessment WFL for stated baseline   Trunk Assessment   Trunk Assessment WFL for stated baseline   Mobility Assessment/Training   Mobility Comment Pt ambulated functional mobility distance of 150 feet with use of FWW, CGA provided for balance, steadying and additional assist to manage medical lines. No overt LOB noted throughout.   Bed Mobility Assessment/Treatment   Bed Mobility, Assistive Device Head of Bed Elevated   Supine-Sit Independence supervision required;1 person + 1 person to manage equipment   Sit to Supine, Independence supervision  required;1 person + 1 person to manage equipment   Impairments endurance;flexibility decreased   Transfer Assessment/Treatment   Sit-Stand Independence contact guard assist   Stand-Sit Independence contact guard assist   Sit-Stand-Sit, Assist Device walker, front wheeled   Transfer Impairments balance impaired;endurance;flexibility decreased   ADL Assessment/Intervention   ADL Comments UB ADLs in unsupported seated position with setup A-SPV   Motor Skills/Interventions   Additional Documentation Functional Endurance (Group)   Balance Skill Training   Comment with use of FWW in standing   Sitting Balance: Static good balance   Sitting, Dynamic (Balance) good balance   Sit-to-Stand Balance fair balance   Standing Balance: Static fair + balance   Standing Balance: Dynamic fair  balance   Systems Impairment Contributing to Balance Disturbance musculoskeletal   Identified Impairments Contributing to Balance Disturbance other (see comments)  (decreased flexibility and activity tolerance)   Functional Endurance Training   Comment, Functional Endurance fair   Post Treatment Status   Post Treatment Patient Status Patient supine in bed;Call light within reach;Telephone within reach   Support Present Post Treatment  None   Communication Post Treatement Nurse   Care Plan Goals   OT Rehab Goals Occupational Therapy Goal;Bed Mobility Goal;Grooming Goal;LB Dressing Goal;Toileting Goal;Transfer Training Goal 2   Occupational Therapy Goals   OT Goal, Date Established 02/10/22   OT Goal, Time to Achieve by discharge   OT Goal, Activity Type ambulate functional household distance with LRAD and mod (I) to promote IND with mobility-related ADLs and IADLs   OT Goal, Independence Level modified independence   Bed Mobility Goal   Bed Mobility Goal, Date Established 02/10/22   Bed Mobility Goal, Time to Achieve by discharge   Bed Mobility Goal, Activity Type all bed mobility activities   Bed Mobility Goal, Independence Level independent   Bed Mobility Goal, Additional Goal HOB flat with no rails   Grooming Goal   Grooming Goal, Date Established 02/10/22   Grooming Goal, Time to Achieve by discharge   Grooming Goal, Activity Type all grooming tasks   Grooming Goal, Independence  modified independence   Grooming Goal, Position standing   Grooming Goal, Additional Goal standing grooming routine   LB Dressing Goal   LB Dressing Goal, Date Established 02/10/22   LB Dressing Goal, Time to Achieve by discharge   LB Dressing Goal, Activity Type all lower body dressing tasks   LB Dressing Goal, Independence Level modified independence   LB Dressing Goal, Additional Goal seated and standing   Toileting Goal   Toileting Goal, Date Established 02/10/22   Toileting Goal, Time to Achieve by discharge   Toileting Goal,  Activity Type all toileting tasks   Toileting Goal, Independence Level modified independence   Transfer Training Goal 2   Transfer Training Goal, Date Established 02/10/22   Transfer Training Goal, Time to Achieve by discharge   Transfer Training Goal, Activity Type bed-to-chair/chair-to-bed;sit-to-stand/stand-to-sit   Transfer Training Goal, Independence Level modified independence   Transfer Training Goal, Assist Device other (see comments)  (LRAD for dynamic ADL routine)   Planned Therapy Interventions, OT Eval   Planned Therapy Interventions ADL retraining;IADL retraining;balance training;bed mobility training;endurance training;strengthening;therapeutic exercise;transfer training   Clinical Impression   Functional Level at Time of Session Mr. Meulemans performed well during OT evaluation this date. Pt admitted for management of HAGMA, hyperkalemia, obstructive uropathy s/p bilateral double ureteral stent placement on 12/20. From a functional standpoint, pt appears to be functioning below his stated baseline regarding safety and IND  with mobility and ADL routine secondary to mild balance deficits, decreased flexibility, O2 requirement, and some post-op discomfort, though was able to complete functional bed mobility, seated UB ADLs, STS transfer, and ambulation of 150 feet with use of FWW, SPV-CGA provided for balance, steadying though no overt LOB noted throughout. From OT perspective, with continued activity and functional progress, recommend d/c home with assist and use of FWW once medically appropriate. OT will continue to follow.   Criteria for Skilled Therapeutic Interventions Met (OT) yes;meets criteria;skilled treatment is necessary   Rehab Potential good   Therapy Frequency 1x/day;minimum of 2x/week   Predicted Duration of Therapy until discharge;until goals are met   Anticipated Equipment Needs at Discharge front wheeled walker   Anticipated Discharge Disposition home with assist   Highest level of  Mobility score   Exercise/Activity Level Performed 7- Walked 25 feet or more   Evaluation Complexity Justification   Occupational Profile Review Expanded review   Performance Deficits 3-5 deficits;Mobility;Balance;Endurance;Strength   Clinical Decision Making Moderate analytic complexity   Evaluation Complexity Moderate     OT Education: Patient/family educated on Role of OT at [facility], energy conservation strategies during ADL, falls prevention, home safety strategies, importance of participating in OOB activity and ADL routine during [name] stay.  Patient/family verbalized/demonstrated initial understanding, would benefit from reinforcement to ensure carry over.    Therapist:   Roosvelt Harps, MOT, OTR/L   Pager #: [phone]

## 2022-02-10 NOTE — Respiratory Therapy (Signed)
[facility]  Respiratory Care Services   Care Plan    Patient Name:  Travis [name], Travis [name]  Date of Service:  02/10/2022  Hospital Day:     LOS: 2 days       Oxygen Devices:  Nasal Cannula:  2LNC qhs     Blood Gas Results:  Recent Labs     02/09/22  2017 02/09/22  2357 02/10/22  0420 02/10/22  0836   FI02 21.0 28.0 28.0 21.0   PH 7.33 7.37 7.36 7.32   PCO2 47 44 49 47   PO2 42 59 43 43   BICARBONATE 23.1 24.6 25.5 22.6   BASEEXCESS  --   --  1.5  --    BASEDEFICIT 1.5 0.2  --  2.2       Summary/Plan of Care:  Patient states never been tested for sleep apnea but has required nasal cannula overnight this admission.      Bryan Lemma, RTR  02/10/2022

## 2022-02-10 NOTE — Ancillary Notes (Signed)
Patient discussed with MICU2 service for evaluation for transfer and has been assigned to [facility] service at this time.      Shane Crutch., MD  02/10/2022

## 2022-02-10 NOTE — Care Plan (Signed)
Patient remains on MICU as a 4-6-5 on no drips. Bicarb drip stopped this shift. K+ and lactate returned to normal. No acute events overnight. Meds given per MAR.

## 2022-02-10 NOTE — Care Management Notes (Signed)
[facility]  Care Management Note    Patient Name: Travis [name]  Date of Birth: 01-08-1961  Sex: male  Date/Time of Admission: 02/08/2022  4:36 PM  Room/Bed: 15/A  Payor: BLUE CROSS BLUE SHIELD / Plan: [place] [employer] P[place] / Product Type: PPO /    LOS: 2 days   Primary Care Providers:  Karlyne Greenspan, NP, NP (General)    Admitting Diagnosis:  Obstructive uropathy [N13.9]    Assessment:      02/10/22 1553   Assessment Details   Assessment Type Continued Assessment   Date of Care Management Update 02/10/22   Date of Next DCP Update 02/14/22   Care Management Plan   Discharge Planning Status plan in progress   Projected Discharge Date 02/11/22   CM will evaluate for rehabilitation potential yes   Discharge Needs Assessment   Discharge Facility/Level of Care Needs Home with DME (code 1)       Nephrology following. PT recs for home at discharge, FWW. Urology following.  Bicarb drip stopped per chart.  Discharge Plan:  Home with DME (code 1)      The patient will continue to be evaluated for developing discharge needs.     Case Manager: Glean Salvo  Phone: [phone]

## 2022-02-11 DIAGNOSIS — Z0489 Encounter for examination and observation for other specified reasons: Secondary | ICD-10-CM

## 2022-02-11 LAB — CBC WITH DIFF
BASOPHIL #: <0.1 10*3/uL (ref <=0.20)
BASOPHIL %: 0.1 %
EOSINOPHIL #: 0.1 10*3/uL (ref <=0.50)
EOSINOPHIL %: 1.2 %
HCT: 29.9 % — ABNORMAL LOW (ref 38.9–52.0)
HGB: 10.3 g/dL — ABNORMAL LOW (ref 13.4–17.5)
IMMATURE GRANULOCYTE #: <0.1 10*3/uL (ref <0.10)
IMMATURE GRANULOCYTE %: 0.5 % (ref 0.0–1.0)
LYMPHOCYTE #: 0.82 10*3/uL — ABNORMAL LOW (ref 1.00–4.80)
LYMPHOCYTE %: 10 %
MCH: 31 pg (ref 26.0–32.0)
MCHC: 34.4 g/dL (ref 31.0–35.5)
MCV: 90.1 fL (ref 78.0–100.0)
MONOCYTE #: 0.65 10*3/uL (ref 0.20–1.10)
MONOCYTE %: 7.9 %
NEUTROPHIL #: 6.59 10*3/uL (ref 1.50–7.70)
NEUTROPHIL %: 80.3 %
PLATELETS: 134 10*3/uL — ABNORMAL LOW (ref 150–400)
RBC: 3.32 10*6/uL — ABNORMAL LOW (ref 4.50–6.10)
RDW-CV: 13.6 % (ref 11.5–15.5)
WBC: 8.2 10*3/uL (ref 3.7–11.0)

## 2022-02-11 LAB — POC BLOOD GLUCOSE (RESULTS)
GLUCOSE, POC: 144 mg/dl — ABNORMAL HIGH (ref 70–105)
GLUCOSE, POC: 180 mg/dl — ABNORMAL HIGH (ref 70–105)
GLUCOSE, POC: 188 mg/dl — ABNORMAL HIGH (ref 70–105)
GLUCOSE, POC: 170 mg/dl — ABNORMAL HIGH (ref 70–105)

## 2022-02-11 LAB — BASIC METABOLIC PANEL
ANION GAP: 11 mmol/L (ref 4–13)
ANION GAP: 15 mmol/L — ABNORMAL HIGH (ref 4–13)
BUN/CREA RATIO: 9 (ref 6–22)
BUN/CREA RATIO: 10 (ref 6–22)
BUN: 104 mg/dL — ABNORMAL HIGH (ref 8–25)
BUN: 112 mg/dL — ABNORMAL HIGH (ref 8–25)
CALCIUM: 7.1 mg/dL — ABNORMAL LOW (ref 8.6–10.3)
CALCIUM: 8 mg/dL — ABNORMAL LOW (ref 8.6–10.3)
CHLORIDE: 94 mmol/L — ABNORMAL LOW (ref 96–111)
CHLORIDE: 87 mmol/L — ABNORMAL LOW (ref 96–111)
CO2 TOTAL: 23 mmol/L (ref 23–31)
CO2 TOTAL: 25 mmol/L (ref 23–31)
CREATININE: 11.2 mg/dL — ABNORMAL HIGH (ref 0.75–1.35)
CREATININE: 11.5 mg/dL — ABNORMAL HIGH (ref 0.75–1.35)
ESTIMATED GFR - MALE: 5 mL/min/BSA — ABNORMAL LOW (ref >=60)
ESTIMATED GFR - MALE: 5 mL/min/BSA — ABNORMAL LOW (ref >=60)
GLUCOSE: 124 mg/dL (ref 65–125)
GLUCOSE: 195 mg/dL — ABNORMAL HIGH (ref 65–125)
POTASSIUM: 4 mmol/L (ref 3.5–5.1)
POTASSIUM: 4 mmol/L (ref 3.5–5.1)
SODIUM: 128 mmol/L — ABNORMAL LOW (ref 136–145)
SODIUM: 127 mmol/L — ABNORMAL LOW (ref 136–145)

## 2022-02-11 LAB — MAGNESIUM: MAGNESIUM: 1.8 mg/dL (ref 1.8–2.6)

## 2022-02-11 LAB — PHOSPHORUS: PHOSPHORUS: 7 mg/dL — ABNORMAL HIGH (ref 2.3–4.0)

## 2022-02-11 MED ORDER — ACETAMINOPHEN 325 MG TABLET
650.0000 mg | ORAL_TABLET | Freq: Four times a day (QID) | ORAL | Status: DC | PRN
Start: 2022-02-11 — End: 2022-02-15
  Administered 2022-02-11 – 2022-02-15 (×2): 650 mg via ORAL
  Filled 2022-02-11: qty 2

## 2022-02-11 MED ORDER — SODIUM CHLORIDE 0.9 % INTRAVENOUS SOLUTION
INTRAVENOUS | Status: DC
Start: 2022-02-11 — End: 2022-02-12
  Administered 2022-02-12: 0 mL via INTRAVENOUS

## 2022-02-11 NOTE — Progress Notes (Signed)
[facility]  INTERNAL MEDICINE  Progress Note     Name: Travis, [name], 61 y.o. male Date of Service: 02/11/2022   MRN: [id number] LOS: 3   PCP: Charna Busman, NP Attending: Dione Booze*   Code Status: Full Code Admitted for: Obstructive uropathy     SUBJECTIVE:      Was placed on 5L NC overnight. No concerns for dyspnea while awake. Plan for foley removal today.     OBJECTIVE:    EXAMINATION:  Temperature: 36.6 C (97.9 F) BP (Non-Invasive): 118/69 Heart Rate: 79   Respiratory Rate: 16 SpO2: 95 % Weight: 112 kg (246 lb 14.6 oz)          Intake/Output Summary (Last 24 hours) at 02/11/2022 1057  Last data filed at 02/11/2022 0700  Gross per 24 hour   Intake 1580 ml   Output 3685 ml   Net -2105 ml     Date of Last Bowel Movement:  (PTA)     General:  NAD  Mouth/Throat:  MMM  Cardiovascular:  RRR, no murmurs, rubs, gallops  Respiratory:  CTABL, breathing comfortably on 5L NC  Abdominal:  BS present; abdomen soft, no TTP  MSK/Extremities:  No edema  Neurological:  Awake, alert, answering questions appropriately    LABS:    CBC Differential   Recent Labs     02/08/22  1700 02/10/22  0420 02/11/22  0423   WBC 12.4* 10.0 8.2   HGB 12.4* 11.3* 10.3*   HCT 36.6* 33.1* 29.9*   PLTCNT 283 227 134*    Recent Labs     02/08/22  1700 02/10/22  0420 02/11/22  0423   PMNS 84.1 79.7 80.3   MONOCYTES 8.1 10.0 7.9   BASOPHILS 0.2  <0.10 0.1  <0.10 0.1  <0.10   PMNABS 10.45* 8.00* 6.59   LYMPHSABS 0.91* 0.95* 0.82*   MONOSABS 1.01 1.00 0.65   EOSABS <0.10 <0.10 0.10      BMP LFTs   Recent Labs     02/11/22  0423   SODIUM 128*   POTASSIUM 4.0   CHLORIDE 94*   CO2 23   BUN 104*   CREATININE 11.20*   ANIONGAP 11   BUNCRRATIO 9   GFR 5*   CALCIUM 7.1*   MAGNESIUM 1.8   PHOSPHORUS 7.0*    No results found for this encounter   CoAgs Blood Gas:   No results found for this encounter No results found for this encounter      Cardiac Markers Lipid Panel   No results for input(s): "TROPONINI", "CKMB", "MBINDEX",  "BNP" in the last 72 hours. No results found for this encounter     IMAGING STUDIES:         PATHOLOGY & MICROBIOLOGY:    No results found for any visits on 02/08/22 (from the past 24 hour(s)).    INPATIENT MEDICATIONS:  acetaminophen (TYLENOL) tablet, 650 mg, Oral, Q6H PRN  calcium carbonate (TUMS) 500mg  (200mg  elemental calcium) chewable tablet, 500 mg, Oral, 3x/day PRN  D5W 250 mL flush bag, , Intravenous, Q15 Min PRN  famotidine (PEPCID) tablet, 20 mg, Oral, Daily  [Held by provider] furosemide (LASIX) 10 mg/mL injection, 20 mg, Intravenous, Daily  heparin 5,000 unit/mL injection, 5,000 Units, Subcutaneous, Q8HRS  NS 250 mL flush bag, , Intravenous, Q15 Min PRN  NS flush syringe, 2-6 mL, Intracatheter, Q8HRS  NS flush syringe, 2-6 mL, Intracatheter, Q1 MIN PRN  oxyBUTYnin (DITROPAN) tablet, 5 mg, Oral, 3x/day  rosuvastatin (CRESTOR) tablet, 10 mg, Oral, QPM  SSIP insulin lispro (HumaLOG) 100 units/mL injection, 0-12 Units, Subcutaneous, 4x/day PRN  [Held by provider] tamsulosin (FLOMAX) capsule, 0.4 mg, Oral, Daily after Dinner         ASSESSMENT:     Travis [name] is a 61 y.o. male with PMH of recurrent nephrolithiasis, HTN, DMII, gout. He presented as a transfer from [place] for obstructive uropathy and hyperkalemia. Workup at [facility] revealed a 9 mm right ureteral stone with severe HUN and resultant acute renal failure with a creatinine of 12.6, BUN 77, potassium of 5.4 and a bicarb of 12. Upon arrival to [facility] his hyperkalemia had worsened to 6.1 while the remainder of his renal parameters remained largely the same. Urology was consulted who urgently took the patient to the OR for rigid cystourethroscopy, retrograde pyelography and bilateral double-J ureteral stent placement. Pt producing urine and is stable for transfer to [facility] on 12/22. Nephrology following given concern for post-obstructive AKI/ATN w/ significantly elevated Cr still approx 10s. Will need to determine when pt can be safely DC w/  outpatient repeat BMP.    Active Hospital Problems    Diagnosis    Primary Problem: Obstructive uropathy        PLAN:   Obstructive acute kidney injury 2/2 bilateral obstructing stones with HUN s/p BL ureteral double J stent placement - improving  Hx of recurrent nephrolithiasis  Asymptomatic hyperkalemia - resolved  HAGMA 2/2 lactic acidosis and elevated BUN - resolved  HTN  - urology consulted; appreciate recs              - s/p stent placement              - f/u in 2-4 weeks for b/l ureteroscopy              - oxybutynin  - foley to be removed today; serial PVR  - nephrology consulted; appreciate recs  - s/p lokelma and bicarb gtt  - VBG stabilized, no longer trending  - avoid nephrotoxins              - home lisinopril held    - Chronic Medical Conditions Managed During This Admission -   HLD - home Crestor  DMII - hold hold oral antiglycemics, on conservative SSI  GERD - Pepcid, Tums   ___    DVT/PE Prophylaxis - Heparin  Consults - nephrology and urology  Hardware (Lines, Drains, Foley, Tubes) - PIV  Diet - Renal  Activity - Up w/ assistance and Up in chair TID w/ all meals  Therapy - PT/OT    ANTICIPATED DISPOSITION PLANNING   Needs - TBD  Location - TBD    Abbie Sons, MD 02/11/2022 10:57  PGY-2, Internal Medicine    I saw and examined the patient.  I reviewed Dr. Nicky Pugh note.  I agree with the findings and plan of care as documented in Dr. Nicky Pugh note.  Any exceptions/additions are edited/noted.    Asencion Noble, MD 16:28 02/11/2022  Hospitalist  [facility]

## 2022-02-11 NOTE — Consults (Signed)
[facility]  FOLLOW UP NEPHROLOGY CONSULT    Date of Service: 02/11/2022  [name]  Date of Admission: 02/08/2022  Consult requested by MICU2, Dr. Blima Singer    Reason for consult:   AKI    Assessment: 65M with a PMH of long-standing Hx of recurrent nephrolithiasis (calcium based: ? Calcium Oxalate vs Calcium Phosphate)(Follows with Urology Outpatient; Hx of Lithotripsy), DMII, HLD, HTN, and Gout. He was transferred from [facility] on 02/08/22 for AKI w/ associated Hyperkalemia 2/2 bilateral obstructive nephrolithiasis. CT Renal Stone protocol showed right moderate-severe hydronephrosis with a 51m right ureteral stone and 2 intrarenal and 656mleft distal urteral stones without significant left-sided hydronephrosis. On 02/08/22 he underwent cystoscopy to which b/l DJ Ureteral Stents were placed. Post-procedure renal U/S only showed mild hydronephrosis. Nephrology was consulted for management of AKI.     # Post-Obstructive AKI, now with post-obstructive diuresis   # Hyponatremia   # Hyperkalemia (resolved)  # NAGMA( resolved)  DDX: AKI 2/2 post obstructive nephropathy   Baseline Scr 1.11 mg/dL, Baseline GFR 76 since 07/2021  Cr Trend: Peak Cr 12.65 mg/dL 12/22. Cr did decreased to 11.2-11.5 today.   UA none  Renal imaging: initial CT abdomen 12/20 showed moderate to severe hydronephrosis on the right. Follow up renal [place] mild right sided hydronephrosis  Lytes: K 6.9-->4.0 Na 131-->133-->130--->128 --> 127   A/B: CO2 12--->19-->25 --->23   PH 7.15-->7.28-->7.32  CKD MB: Corrected calcium: 7.1, albumin 4.6  Anemia: Hgb 10.3  Cardiology:   Volume status  Euvolemic   UO non-oliguric 2.4L 12/21  ID  BCx: none  UCx: none  Antibiotics: cefazolin 12/20, levofloxacin 12/20      Recommendations:  - Hyperkalemia and NAGMA have resolved.   - AKI showing level of improvement. Robust Urine output and Cr Appears to have peaked 12/22 and is showing a level of downtrend.    - Continue to encourage PO intake with some diet clear sodas today to supplement water intake given robust urine output. But due to Serum Na now decreased to 127, will add NS @ 75 cc/H.   - Repeat BMP @ midnight and then @ 0600 to monitor renal function and serum sodium.      OP OSA Assessment is needed. + Hypoxia at night when sleeping only.   Daily BMP, mag, phos  Avoid nephrotoxins, renally dose all medications  Avoid subclavian catheters with eGFR <30 mL/min  Strict I/O, monitor all urine output  Consider alternatives to radiocontrast procedures when possible    Objective   Filed Vitals:    02/10/22 2000 02/11/22 0000 02/11/22 0400 02/11/22 0800   BP: 128/74 120/81 118/69    Pulse: 91 83 79    Resp: _0 Temp: 36.8 C (98.2 F) 36.6 C (97.9 F) 36.8 C (98.2 F) 36.6 C (97.9 F)   SpO2: 95% 91% 95%      Constitutional:  no distress and vital signs reviewed  Eyes:  Conjunctiva clear, Pupils equal and round.   ENT: Mucous membranes moist.  Respiratory:  Respirations even and unlabored on NC w/ expansion symmetric bilaterally  Cardiovascular: RRR,  Musculoskeletal:  Head atraumatic and normocephalic  Integumentary:  Skin warm and dry  Neurologic:  Alert and OrScrantonMD - 02/11/2022  We[facility] LtdNephrology Fellow PGY-V      Attending Attestation     Patient was seen  and assessed (HPI + Physical Examination) and chart was reviewed. Patient's renal condition was discussed in detailed and care plan was developed with the Fellow. I reviewed the Fellow's note and noted was edited accordingly as above. Agree with the above assessment and care plan.     Ophelia Charter, MD  02/11/2022, 18:11  Assistant Professor  Department of Medicine, Section of Nephrology   [facility]

## 2022-02-11 NOTE — Respiratory Therapy (Signed)
Patient is requiring 5L NC when sleeping this admission.

## 2022-02-12 ENCOUNTER — Other Ambulatory Visit: Payer: Self-pay

## 2022-02-12 ENCOUNTER — Inpatient Hospital Stay (HOSPITAL_COMMUNITY): Payer: BC Managed Care – PPO

## 2022-02-12 LAB — CBC WITH DIFF
BASOPHIL #: <0.1 10*3/uL (ref <=0.20)
BASOPHIL %: 0.1 %
EOSINOPHIL #: 0.12 10*3/uL (ref <=0.50)
EOSINOPHIL %: 1.3 %
HCT: 31.2 % — ABNORMAL LOW (ref 38.9–52.0)
HGB: 11 g/dL — ABNORMAL LOW (ref 13.4–17.5)
IMMATURE GRANULOCYTE #: <0.1 10*3/uL (ref <0.10)
IMMATURE GRANULOCYTE %: 0.3 % (ref 0.0–1.0)
LYMPHOCYTE #: 0.71 10*3/uL — ABNORMAL LOW (ref 1.00–4.80)
LYMPHOCYTE %: 7.6 %
MCH: 31.3 pg (ref 26.0–32.0)
MCHC: 35.3 g/dL (ref 31.0–35.5)
MCV: 88.9 fL (ref 78.0–100.0)
MONOCYTE #: 0.71 10*3/uL (ref 0.20–1.10)
MONOCYTE %: 7.6 %
MPV: 10 fL (ref 8.7–12.5)
NEUTROPHIL #: 7.78 10*3/uL — ABNORMAL HIGH (ref 1.50–7.70)
NEUTROPHIL %: 83.1 %
PLATELETS: 170 10*3/uL (ref 150–400)
RBC: 3.51 10*6/uL — ABNORMAL LOW (ref 4.50–6.10)
RDW-CV: 13.4 % (ref 11.5–15.5)
WBC: 9.4 10*3/uL (ref 3.7–11.0)

## 2022-02-12 LAB — BASIC METABOLIC PANEL
ANION GAP: 13 mmol/L (ref 4–13)
BUN/CREA RATIO: 9 (ref 6–22)
BUN: 104 mg/dL — ABNORMAL HIGH (ref 8–25)
CALCIUM: 8.1 mg/dL — ABNORMAL LOW (ref 8.6–10.3)
CHLORIDE: 90 mmol/L — ABNORMAL LOW (ref 96–111)
CO2 TOTAL: 24 mmol/L (ref 23–31)
CREATININE: 11.38 mg/dL — ABNORMAL HIGH (ref 0.75–1.35)
ESTIMATED GFR - MALE: 5 mL/min/BSA — ABNORMAL LOW (ref >=60)
GLUCOSE: 148 mg/dL — ABNORMAL HIGH (ref 65–125)
POTASSIUM: 3.8 mmol/L (ref 3.5–5.1)
SODIUM: 127 mmol/L — ABNORMAL LOW (ref 136–145)

## 2022-02-12 LAB — POC BLOOD GLUCOSE (RESULTS)
GLUCOSE, POC: 137 mg/dl — ABNORMAL HIGH (ref 70–105)
GLUCOSE, POC: 186 mg/dl — ABNORMAL HIGH (ref 70–105)
GLUCOSE, POC: 157 mg/dl — ABNORMAL HIGH (ref 70–105)

## 2022-02-12 LAB — MAGNESIUM: MAGNESIUM: 1.8 mg/dL (ref 1.8–2.6)

## 2022-02-12 LAB — PHOSPHORUS: PHOSPHORUS: 6.5 mg/dL — ABNORMAL HIGH (ref 2.3–4.0)

## 2022-02-12 LAB — SODIUM
SODIUM: 129 mmol/L — ABNORMAL LOW (ref 136–145)
SODIUM: 136 mmol/L (ref 136–145)

## 2022-02-12 LAB — LACTIC ACID - FIRST REFLEX: LACTIC ACID: 0.7 mmol/L (ref 0.5–2.2)

## 2022-02-12 MED ORDER — POLYETHYLENE GLYCOL 3350 17 GRAM ORAL POWDER PACKET
34.0000 g | Freq: Every day | ORAL | Status: DC
Start: 2022-02-12 — End: 2022-02-15
  Administered 2022-02-12 – 2022-02-15 (×4): 34 g via ORAL
  Filled 2022-02-12 (×5): qty 2

## 2022-02-12 MED ORDER — SENNOSIDES 8.6 MG-DOCUSATE SODIUM 50 MG TABLET
2.0000 | ORAL_TABLET | Freq: Two times a day (BID) | ORAL | Status: DC
Start: 2022-02-12 — End: 2022-02-15
  Administered 2022-02-12 – 2022-02-13 (×3): 2 via ORAL
  Administered 2022-02-13: 0 via ORAL
  Administered 2022-02-14 – 2022-02-15 (×3): 2 via ORAL
  Filled 2022-02-12 (×8): qty 2

## 2022-02-12 NOTE — Progress Notes (Signed)
[facility]  INTERNAL MEDICINE  Progress Note     Name: Travis [name], Travis [name], 61 y.o. male Date of Service: 02/12/2022   MRN: [id number] LOS: 4   PCP: Karlyne Greenspan, NP Attending: Azucena Freed*   Code Status: Full Code Admitted for: Obstructive uropathy     SUBJECTIVE:      NAEO.    OBJECTIVE:    EXAMINATION:  Temperature: 37 C (98.6 F) BP (Non-Invasive): (!) 148/71 Heart Rate: 80   Respiratory Rate: 18 SpO2: 96 % Weight: 112 kg (246 lb 14.6 oz)          Intake/Output Summary (Last 24 hours) at 02/12/2022 1154  Last data filed at 02/12/2022 1106  Gross per 24 hour   Intake 937.5 ml   Output 5315 ml   Net -4377.5 ml     Date of Last Bowel Movement:  (PTA)     General:  NAD  Mouth/Throat:  MMM  Cardiovascular:  RRR, no murmurs, rubs, gallops  Respiratory:  CTABL, breathing comfortably on 5L NC  Abdominal:  BS present; abdomen soft, no TTP  MSK/Extremities:  No edema  Neurological:  Awake, alert, answering questions appropriately    LABS:    CBC Differential   Recent Labs     02/10/22  0420 02/11/22  0423 02/12/22  0327   WBC 10.0 8.2 9.4   HGB 11.3* 10.3* 11.0*   HCT 33.1* 29.9* 31.2*   PLTCNT 227 134* 170    Recent Labs     02/10/22  0420 02/11/22  0423 02/12/22  0327   PMNS 79.7 80.3 83.1   MONOCYTES 10.0 7.9 7.6   BASOPHILS 0.1  <0.10 0.1  <0.10 0.1  <0.10   PMNABS 8.00* 6.59 7.78*   LYMPHSABS 0.95* 0.82* 0.71*   MONOSABS 1.00 0.65 0.71   EOSABS <0.10 0.10 0.12      BMP LFTs   Recent Labs     02/11/22  1718 02/12/22  0032 02/12/22  0327   SODIUM 127* 129* 127*   POTASSIUM 4.0  --  3.8   CHLORIDE 87*  --  90*   CO2 25  --  24   BUN 112*  --  104*   CREATININE 11.50*  --  11.38*   ANIONGAP 15*  --  13   BUNCRRATIO 10  --  9   GFR 5*  --  5*   CALCIUM 8.0*  --  8.1*   MAGNESIUM  --   --  1.8   PHOSPHORUS  --   --  6.5*    No results found for this encounter   CoAgs Blood Gas:   No results found for this encounter No results found for this encounter      Cardiac Markers Lipid Panel   No  results for input(s): "TROPONINI", "CKMB", "MBINDEX", "BNP" in the last 72 hours. No results found for this encounter     IMAGING STUDIES:         PATHOLOGY & MICROBIOLOGY:    No results found for any visits on 02/08/22 (from the past 24 hour(s)).    INPATIENT MEDICATIONS:  acetaminophen (TYLENOL) tablet, 650 mg, Oral, Q6H PRN  calcium carbonate (TUMS) 500mg  (200mg  elemental calcium) chewable tablet, 500 mg, Oral, 3x/day PRN  D5W 250 mL flush bag, , Intravenous, Q15 Min PRN  famotidine (PEPCID) tablet, 20 mg, Oral, Daily  [Held by provider] furosemide (LASIX) 10 mg/mL injection, 20 mg, Intravenous, Daily  heparin 5,000 unit/mL injection, 5,000 Units,  Subcutaneous, Q8HRS  NS 250 mL flush bag, , Intravenous, Q15 Min PRN  NS flush syringe, 2-6 mL, Intracatheter, Q8HRS  NS flush syringe, 2-6 mL, Intracatheter, Q1 MIN PRN  NS premix infusion, , Intravenous, Continuous  oxyBUTYnin (DITROPAN) tablet, 5 mg, Oral, 3x/day  polyethylene glycol (MIRALAX) oral packet, 34 g, Oral, Daily  rosuvastatin (CRESTOR) tablet, 10 mg, Oral, QPM  sennosides-docusate sodium (SENOKOT-S) 8.6-50mg  per tablet, 2 Tablet, Oral, 2x/day  SSIP insulin lispro (HumaLOG) 100 units/mL injection, 0-12 Units, Subcutaneous, 4x/day PRN  [Held by provider] tamsulosin (FLOMAX) capsule, 0.4 mg, Oral, Daily after Dinner         ASSESSMENT:     Travis [name] is a 61 y.o. male with PMH of recurrent nephrolithiasis, HTN, DMII, gout. He presented as a transfer from [place] for obstructive uropathy and hyperkalemia. Workup at [facility] revealed a 9 mm right ureteral stone with severe HUN and resultant acute renal failure with a creatinine of 12.6, BUN 77, potassium of 5.4 and a bicarb of 12. Upon arrival to [facility] his hyperkalemia had worsened to 6.1 while the remainder of his renal parameters remained largely the same. Urology was consulted who urgently took the patient to the OR for rigid cystourethroscopy, retrograde pyelography and bilateral double-J  ureteral stent placement. Pt producing urine and is stable for transfer to [facility] on 12/22. Nephrology following given concern for post-obstructive AKI/ATN w/ significantly elevated Cr still approx 10s. Will need to determine when pt can be safely DC w/ outpatient repeat BMP.    Active Hospital Problems    Diagnosis    Primary Problem: Obstructive uropathy        PLAN:   Obstructive acute kidney injury 2/2 bilateral obstructing stones with HUN s/p BL ureteral double J stent placement - improving  Hx of recurrent nephrolithiasis  Asymptomatic hyperkalemia - resolved  HAGMA 2/2 lactic acidosis and elevated BUN - resolved  HTN  - urology consulted; appreciate recs              - s/p stent placement              - f/u in 2-4 weeks for b/l ureteroscopy              - oxybutynin  - foley removed; serial PVR  - nephrology consulted; appreciate recs  - s/p lokelma and bicarb gtt  - VBG stabilized, no longer trending  - renal U/S ordered for re-evaluation  - IVF with NS at 75 ml/h, Na checks Q8H  - avoid nephrotoxins              - home lisinopril held   - NaCl maintenance fluid per nephro    Nocturnal O2 need  Concern for sleep apnea  - pt never evaluated for OSA  - will plan for outpatient sleep study    - Chronic Medical Conditions Managed During This Admission -   HLD - home Crestor  DMII - hold hold oral antiglycemics, on conservative SSI  GERD - Pepcid, Tums   ___    DVT/PE Prophylaxis - Heparin  Consults - nephrology and urology  Hardware (Lines, Drains, Foley, Tubes) - PIV  Diet - Renal  Activity - Up w/ assistance and Up in chair TID w/ all meals  Therapy - PT/OT    ANTICIPATED DISPOSITION PLANNING   Needs - TBD  Location - TBD    Abbie Sons, MD 02/12/2022 11:54  PGY-2, Internal Medicine    I saw and examined the patient.  I reviewed Dr. Lianne Moris note.  I agree with the findings and plan of care as documented in Dr. Lianne Moris note.  Any exceptions/additions are edited/noted.    Orlene Erm, MD 17:19  02/12/2022  Hospitalist  [employer]

## 2022-02-12 NOTE — Nurses Notes (Signed)
Received pt from MICU. Report taken from MICU RN. Pt oriented to room. VS stable.

## 2022-02-12 NOTE — Care Plan (Signed)
Problem: Adult Inpatient Plan of Care  Goal: Plan of Care Review  Outcome: Ongoing (see interventions/notes)  Goal: Patient-Specific Goal (Individualized)  Outcome: Ongoing (see interventions/notes)  Flowsheets (Taken 02/12/2022 1741)  Individualized Care Needs: OOB w/ FWW and Ax1  Goal: Absence of Hospital-Acquired Illness or Injury  Outcome: Ongoing (see interventions/notes)  Intervention: Prevent Skin Injury  Recent Flowsheet Documentation  Taken 02/12/2022 0840 by Lonie Peak, RN  Skin Protection: adhesive use limited  Intervention: Prevent and Manage VTE (Venous Thromboembolism) Risk  Recent Flowsheet Documentation  Taken 02/12/2022 0840 by Lonie Peak, RN  VTE Prevention/Management: anticoagulant therapy maintained  Goal: Optimal Comfort and Wellbeing  Outcome: Ongoing (see interventions/notes)  Intervention: Provide Person-Centered Care  Recent Flowsheet Documentation  Taken 02/12/2022 0840 by Lonie Peak, RN  Trust Relationship/Rapport: care explained  Goal: Rounds/Family Conference  Outcome: Ongoing (see interventions/notes)     Problem: Fall Injury Risk  Goal: Absence of Fall and Fall-Related Injury  Outcome: Ongoing (see interventions/notes)     Problem: Skin Injury Risk Increased  Goal: Skin Health and Integrity  Outcome: Ongoing (see interventions/notes)  Intervention: Optimize Skin Protection  Recent Flowsheet Documentation  Taken 02/12/2022 0840 by Lonie Peak, RN  Pressure Reduction Techniques: Frequent weight shifting encouraged  Pressure Reduction Devices: Repositioning wedges/pillows utilized  Skin Protection: adhesive use limited     Problem: Acute Rehab Services Goal & Intervention Plan  Goal: Bathing Goal  Description: Stand Alone Therapy Goal  Outcome: Ongoing (see interventions/notes)  Goal: Bed Mobility Goal  Description: Stand Alone Therapy Goal  Outcome: Ongoing (see interventions/notes)  Goal: Caregiver Training Goal  Description: Stand Alone Therapy Goal  Outcome: Ongoing  (see interventions/notes)  Goal: Cognition Goal  Description: Stand Alone Therapy Goal  Outcome: Ongoing (see interventions/notes)  Goal: Cognition Goals, SLP  Description: Stand Alone Therapy Goal  Outcome: Ongoing (see interventions/notes)  Goal: Communication Goals, SLP  Description: Stand Alone Therapy Goal  Outcome: Ongoing (see interventions/notes)  Goal: Dysphagia Goals, SLP  Description: Stand Alone Therapy Goal  Outcome: Ongoing (see interventions/notes)  Goal: Eating Self-Feeding Goal  Description: Stand Alone Therapy Goal  Outcome: Ongoing (see interventions/notes)  Goal: Gait Training Goal  Description: Stand Alone Therapy Goal  Outcome: Ongoing (see interventions/notes)  Goal: Grooming Goal  Description: Stand Alone Therapy Goal  Outcome: Ongoing (see interventions/notes)  Goal: Home Management Goal  Description: Stand Alone Therapy Goal  Outcome: Ongoing (see interventions/notes)  Goal: Interprofessional Goal  Description: Stand Alone Therapy Goal  Outcome: Ongoing (see interventions/notes)  Goal: LB Dressing Goal  Description: Stand Alone Therapy Goal  Outcome: Ongoing (see interventions/notes)  Goal: Occupational Therapy Goals  Description: Stand Alone Therapy Goal  Outcome: Ongoing (see interventions/notes)  Goal: Physical Therapy Goal  Description: Stand Alone Therapy Goal  Outcome: Ongoing (see interventions/notes)  Goal: Range of Motion Goal  Description: Stand Alone Therapy Goal  Outcome: Ongoing (see interventions/notes)  Goal: Strength Goal  Description: Stand Alone Therapy Goal  Outcome: Ongoing (see interventions/notes)  Goal: Toileting Goal  Description: Stand Alone Therapy Goal  Outcome: Ongoing (see interventions/notes)  Goal: Goal Transfer Training  Description: Stand Alone Therapy Goal  Outcome: Ongoing (see interventions/notes)  Goal: UB Dressing Goal  Description: Stand Alone Therapy Goal  Outcome: Ongoing (see interventions/notes)   Plan of care reviewed. Pt AOx4 and cooperative with  care this shift.  Pain managed with meds per MAR. Assessment per doc flow. Pt free from falls this shift. Needs met with hourly rounding. Pt resting at this time with call bell in reach.  Wheels locked and bed in lowest position. Patient ambulated in the hall x1 with FWW. Patient weaned from O2 this shift. Occasional de-sats while sleeping. MIVF stopped this shift.  Will continue to monitor.

## 2022-02-12 NOTE — Care Plan (Signed)
Problem: Skin Injury Risk Increased  Goal: Skin Health and Integrity  Outcome: Ongoing (see interventions/notes)  Intervention: Optimize Skin Protection  Recent Flowsheet Documentation  Taken 02/12/2022 2223 by Pershing Cox, RN  Pressure Reduction Techniques:   Pressure points protected   Frequent weight shifting encouraged  Pressure Reduction Devices:   Pressure reduction chair cushion utilized   Repositioning wedges/pillows utilized  Skin Protection: adhesive use limited  Activity Management: ROM, active encouraged  Head of Bed (HOB) Positioning: HOB elevated

## 2022-02-12 NOTE — Consults (Signed)
[facility]  FOLLOW UP NEPHROLOGY CONSULT    Date of Service: 02/12/2022  Travis [name]  Date of Admission: 02/08/2022  Consult requested by MICU2, Dr. Blima Singer    Reason for consult:   AKI    Assessment: 30M with a PMH of long-standing Hx of recurrent nephrolithiasis (calcium based: ? Calcium Oxalate vs Calcium Phosphate)(Follows with Urology Outpatient; Hx of Lithotripsy), DMII, HLD, HTN, and Gout. He was transferred from [facility] on 02/08/22 for AKI w/ associated Hyperkalemia 2/2 bilateral obstructive nephrolithiasis. CT Renal Stone protocol showed right moderate-severe hydronephrosis with a 6m right ureteral stone and 2 intrarenal and 660mleft distal urteral stones without significant left-sided hydronephrosis. On 02/08/22 he underwent cystoscopy to which b/l DJ Ureteral Stents were placed. Post-procedure renal U/S only showed mild hydronephrosis. Nephrology was consulted for management of AKI.     # Post-Obstructive AKI, now with post-obstructive diuresis   - creatinine is now stagnant since 12/21 with continued postobstructive diuresis and patient has left CVA tenderness there is concern that it will take time to resolve as there is unknown chronicity of the insult prior to patient presentation however this is a much slower recovery than expected.  Recommendations  - increase strict I's and O's to q.4 hours to assess for polyphagia polyuria  -encouraged patient to decrease p.o. intake given the start of IV fluids  , see hyponatremia below  -recommend renal ultrasound  - if there is any other concern for infection recommend repeat UA micro macro at this time    # Hyponatremia   - concern for hypovolemic hyponatremia in the setting of postobstructive diuresis with elevated free water intake (5-6L/day)  Recommendations  - sodium now stable at 127  -had patient decreased free water intake to 3 L per day from 5-6 L per day  -okay for patient to have diet  sodas or other p.o. intake aside from free water from a nephrology standpoint  - started normal saline at 75 cc/hour 12/20 3:00 p.m. when sodium approach 127, recommend continue this at this time  -continue to trend sodium q.8 hours while on normal saline      # Hyperkalemia (resolved)  # NAGMA( resolved)  DDX: AKI 2/2 post obstructive nephropathy        OP OSA Assessment is needed. + Hypoxia at night when sleeping only.   Daily BMP, mag, phos  Avoid nephrotoxins, renally dose all medications  Avoid subclavian catheters with eGFR <30 mL/min  Strict I/O, monitor all urine output  Consider alternatives to radiocontrast procedures when possible    Objective   Filed Vitals:    02/12/22 0754 02/12/22 0800 02/12/22 1200 02/12/22 1600   BP:  (!) 148/71 (!) 156/74    Pulse:  80 69    Resp:  18 18    Temp: 37 C (98.6 F)  36.7 C (98.1 F) 36.7 C (98.1 F)   SpO2:  96% 95%      Constitutional:  no distress and vital signs reviewed  Eyes:  Conjunctiva clear, Pupils equal and round.   ENT: Mucous membranes moist.  Respiratory:  Respirations even and unlabored on NC w/ expansion symmetric bilaterally  Cardiovascular: RRR  Musculoskeletal:  Head atraumatic and normocephalic, (+) CVA tenderness on L   Integumentary:  Skin warm and dry  Neurologic:  Alert and Oriented        AbClifton CustardMD - 02/12/2022  We[facility]portNephrology Fellow PGY-V  Attending Attestation     Patient was seen and assessed (HPI + Physical Examination) and chart was reviewed. Patient's renal condition was discussed in detailed and care plan was developed with the Fellow. I reviewed the Fellow's note. I agree with the findings and plan of care as documented in the fellow's note.     AKI is 2/2 post-obstructive nephropathy. Despite post-obstructive diuresis, Cr with little improvement, indicating obstruction was subacute-chronic in nature. No urgent indication for RRT.     Hyponatremia was likely 2/2 higher free water intake  relative to free water clearance. Patient was drinking 5-6L of fluid per day. Na was 127-129. Asked patient to reduced free water intake and also gave NS gtt, now serum Na increased to 136. Stopped NS gtt and instructed primary team to have patient drink to his thirst. Please repeat serum Na @ 10pm tonight. Call nephrology fellow if serum Na > 135.     Travis Charter, MD  02/12/2022, 19:35  Assistant Professor  Department of Medicine, Section of Nephrology   [name]

## 2022-02-13 LAB — BASIC METABOLIC PANEL
ANION GAP: 12 mmol/L (ref 4–13)
BUN/CREA RATIO: 11 (ref 6–22)
BUN: 89 mg/dL — ABNORMAL HIGH (ref 8–25)
CALCIUM: 8 mg/dL — ABNORMAL LOW (ref 8.6–10.3)
CHLORIDE: 102 mmol/L (ref 96–111)
CO2 TOTAL: 24 mmol/L (ref 23–31)
CREATININE: 8.15 mg/dL — ABNORMAL HIGH (ref 0.75–1.35)
ESTIMATED GFR - MALE: 7 mL/min/BSA — ABNORMAL LOW (ref >=60)
GLUCOSE: 147 mg/dL — ABNORMAL HIGH (ref 65–125)
POTASSIUM: 3.3 mmol/L — ABNORMAL LOW (ref 3.5–5.1)
SODIUM: 138 mmol/L (ref 136–145)

## 2022-02-13 LAB — SODIUM
SODIUM: 137 mmol/L (ref 136–145)
SODIUM: 138 mmol/L (ref 136–145)
SODIUM: 138 mmol/L (ref 136–145)

## 2022-02-13 LAB — CBC WITH DIFF
BASOPHIL #: <0.1 10*3/uL (ref <=0.20)
BASOPHIL %: 0.3 %
EOSINOPHIL #: 0.14 10*3/uL (ref <=0.50)
EOSINOPHIL %: 1.9 %
HCT: 29.9 % — ABNORMAL LOW (ref 38.9–52.0)
HGB: 10.5 g/dL — ABNORMAL LOW (ref 13.4–17.5)
IMMATURE GRANULOCYTE #: <0.1 10*3/uL (ref <0.10)
IMMATURE GRANULOCYTE %: 0.4 % (ref 0.0–1.0)
LYMPHOCYTE #: 0.57 10*3/uL — ABNORMAL LOW (ref 1.00–4.80)
LYMPHOCYTE %: 7.8 %
MCH: 31.3 pg (ref 26.0–32.0)
MCHC: 35.1 g/dL (ref 31.0–35.5)
MCV: 89.3 fL (ref 78.0–100.0)
MONOCYTE #: 0.54 10*3/uL (ref 0.20–1.10)
MONOCYTE %: 7.4 %
MPV: 10.1 fL (ref 8.7–12.5)
NEUTROPHIL #: 6.02 10*3/uL (ref 1.50–7.70)
NEUTROPHIL %: 82.2 %
PLATELETS: 173 10*3/uL (ref 150–400)
RBC: 3.35 10*6/uL — ABNORMAL LOW (ref 4.50–6.10)
RDW-CV: 13.2 % (ref 11.5–15.5)
WBC: 7.3 10*3/uL (ref 3.7–11.0)

## 2022-02-13 LAB — POTASSIUM,RANDOM URINE: POTASSIUM RANDOM URINE: 11 mmol/L

## 2022-02-13 LAB — OSMOLALITY, RANDOM URINE: OSMOLALITY URINE: 283 mOsm/kg (ref 50–1400)

## 2022-02-13 LAB — POC BLOOD GLUCOSE (RESULTS)
GLUCOSE, POC: 144 mg/dl — ABNORMAL HIGH (ref 70–105)
GLUCOSE, POC: 191 mg/dl — ABNORMAL HIGH (ref 70–105)
GLUCOSE, POC: 192 mg/dl — ABNORMAL HIGH (ref 70–105)
GLUCOSE, POC: 185 mg/dl — ABNORMAL HIGH (ref 70–105)

## 2022-02-13 LAB — UREA NITROGEN, RANDOM URINE: UREA NITROGEN RANDOM URINE: 365 mg/dL — ABNORMAL LOW (ref 750–1200)

## 2022-02-13 LAB — PHOSPHORUS: PHOSPHORUS: 4.7 mg/dL — ABNORMAL HIGH (ref 2.3–4.0)

## 2022-02-13 LAB — MAGNESIUM: MAGNESIUM: 1.8 mg/dL (ref 1.8–2.6)

## 2022-02-13 LAB — SODIUM, RANDOM URINE: SODIUM RANDOM URINE: 47 mmol/L

## 2022-02-13 MED ORDER — BISACODYL 10 MG RECTAL SUPPOSITORY
10.0000 mg | Freq: Once | RECTAL | Status: DC | PRN
Start: 2022-02-13 — End: 2022-02-15

## 2022-02-13 MED ORDER — LACTULOSE 20 GRAM/30 ML ORAL SOLUTION
30.0000 mL | Freq: Every day | ORAL | Status: DC
Start: 2022-02-13 — End: 2022-02-14
  Administered 2022-02-13: 30 mL via ORAL
  Filled 2022-02-13 (×2): qty 30

## 2022-02-13 MED ORDER — POTASSIUM CHLORIDE ER 20 MEQ TABLET,EXTENDED RELEASE(PART/CRYST)
20.0000 meq | ORAL_TABLET | Freq: Once | ORAL | Status: AC
Start: 2022-02-13 — End: 2022-02-13
  Administered 2022-02-13: 20 meq via ORAL
  Filled 2022-02-13: qty 1

## 2022-02-13 MED ORDER — POTASSIUM CHLORIDE ER 20 MEQ TABLET,EXTENDED RELEASE(PART/CRYST)
40.0000 meq | ORAL_TABLET | Freq: Once | ORAL | Status: AC
Start: 2022-02-13 — End: 2022-02-13
  Administered 2022-02-13: 40 meq via ORAL
  Filled 2022-02-13: qty 2

## 2022-02-13 NOTE — Consults (Signed)
[facility]  FOLLOW UP NEPHROLOGY CONSULT    Date of Service: 02/13/2022  Travis [name]  Date of Admission: 02/08/2022  Consult requested by MICU2, Dr. Blima Singer    Reason for consult:   AKI    Assessment: Travis [name] with a PMH of long-standing Hx of recurrent nephrolithiasis (calcium based: ? Calcium Oxalate vs Calcium Phosphate)(Follows with Urology Outpatient; Hx of Lithotripsy), DMII, HLD, HTN, and Gout. He was transferred from [facility] on 02/08/22 for AKI w/ associated Hyperkalemia 2/2 bilateral obstructive nephrolithiasis. CT Renal Stone protocol showed right moderate-severe hydronephrosis with a 32m right ureteral stone and 2 intrarenal and 642mleft distal urteral stones without significant left-sided hydronephrosis. On 02/08/22 he underwent cystoscopy to which b/l DJ Ureteral Stents were placed. Post-procedure renal U/S only showed mild hydronephrosis. Nephrology was consulted for management of AKI.     # Post-Obstructive AKI, now with post-obstructive diuresis -improving  - creatinine is now stagnant since 12/21 with continued postobstructive diuresis and patient has left CVA tenderness there is concern that it will take time to resolve as there is unknown chronicity of the insult prior to patient presentation however this is a much slower recovery than expected.  Recommendations  - continue strict I's and O's q.4   - continue free water restriction however patient can be slightly liberalized from prior day, see hyponatremia below  -renal ultrasound does not show any significant unexpected changes at this time it may take several weeks to months for hydro to completely resolve official read pending  - if there is any other concern for infection recommend repeat UA micro macro at this time  - urine output decreasing    # Hyponatremia - improved  -significant improvement on 12/24 following decreased p.o. free water intake  Recommendations  - okay for patient  to drink approximately 4-4-1/2 L 12/25 will continue to assess daily pending sodium levels  -okay for patient to have diet sodas or other p.o. intake aside from free water from a nephrology standpoint  -continue to trend sodium b.i.d.      # Hyperkalemia (resolved)  # NAGMA( resolved)  DDX: AKI 2/2 post obstructive nephropathy        OP OSA Assessment is needed. + Hypoxia at night when sleeping only.   Daily BMP, mag, phos  Avoid nephrotoxins, renally dose all medications for creatinine clearance daily  Avoid subclavian catheters with eGFR <30 mL/min  Strict I/O, monitor all urine output  Consider alternatives to radiocontrast procedures when possible    Objective   Filed Vitals:    02/12/22 2200 02/12/22 2300 02/13/22 0749 02/13/22 1041   BP: 134/75 (!) 143/64     Pulse:  80 80 80   Resp:  _0 Temp: 36.8 C (98.2 F) 36.7 C (98.1 F) 36.6 C (97.9 F) 36.6 C (97.9 F)   SpO2: 94% 94% 95% 95%     Constitutional:  no distress and vital signs reviewed sitting up in bed  Eyes:  Conjunctiva clear, Pupils equal and round.   ENT: Mucous membranes moist.  Respiratory:  Respirations even and unlabored on room air w/ expansion symmetric bilaterally  Cardiovascular:  No peripheral edema  Musculoskeletal:  Head atraumatic and normocephalic  Integumentary:  Skin warm and dry  Neurologic:  Alert and OrSaxonMD - 02/13/2022  [facility] Fellow PGY-V      Attending attestation note:  I saw and examined the patient.  I reviewed Dr. Jacklynn Barnacle note.  I agree with the findings and plan of care as documented in the fellow's note. Any additions/changes were made accordingly.    Patient with polyuria, 6.9 L urine output yesterday. Likely post-obstructive diuresis/ ATN polyuric phase. Also, given patient's increasing PO intake may have been contributing.   Would check spot urine osmolality, [place], [place] and UUN.   Repeat renal USG grossly reviewed, pending official read.    Cr improving, BUN improving.   Na above target. 127 > 138 mEq (goal was 135 mEq or lower by 4 am 12/25). Would recommend increased PO intake at this time (he was previously drinking a lot of fluids) and repeat Na/BMP in evening. If Na trending any higher, would recommend starting on D5W at 3 cc/kg/h. Please page on-call nephrology for guidance once lab results if overcorrected. Goal normonatremia (no more than 143 mEq/L by 4 am 12/26).       02/13/2022, 15:08    Joaquim Lai, M.D.  Assistant Professor of Medicine  Section of Nephrology

## 2022-02-13 NOTE — Progress Notes (Signed)
[facility]  INTERNAL MEDICINE  Progress Note     Name: Travis, [name], 61 y.o. male Date of Service: 02/13/2022   MRN: [id number] LOS: 5   PCP: Karlyne Greenspan, NP Attending: Azucena Freed*   Code Status: Full Code Admitted for: Obstructive uropathy     SUBJECTIVE:      NAEO. Continues to have robust urinary output.    OBJECTIVE:    EXAMINATION:  Temperature: 36.6 C (97.9 F) BP (Non-Invasive): (!) 143/64 Heart Rate: 80   Respiratory Rate: 16 SpO2: 95 % Weight: 112 kg (246 lb 14.6 oz)          Intake/Output Summary (Last 24 hours) at 02/13/2022 1114  Last data filed at 02/13/2022 1001  Gross per 24 hour   Intake 4310 ml   Output 5450 ml   Net -1140 ml     Date of Last Bowel Movement:  (PTA)     General:  NAD  Mouth/Throat:  MMM  Cardiovascular:  RRR, no murmurs, rubs, gallops  Respiratory:  CTABL, breathing comfortably on RA  Abdominal:  BS present; abdomen soft, no TTP  MSK/Extremities:  No edema  Neurological:  Awake, alert, answering questions appropriately    LABS:    CBC Differential   Recent Labs     02/11/22  0423 02/12/22  0327 02/13/22  0433   WBC 8.2 9.4 7.3   HGB 10.3* 11.0* 10.5*   HCT 29.9* 31.2* 29.9*   PLTCNT 134* 170 173    Recent Labs     02/11/22  0423 02/12/22  0327 02/13/22  0433   PMNS 80.3 83.1 82.2   MONOCYTES 7.9 7.6 7.4   BASOPHILS 0.1  <0.10 0.1  <0.10 0.3  <0.10   PMNABS 6.59 7.78* 6.02   LYMPHSABS 0.82* 0.71* 0.57*   MONOSABS 0.65 0.71 0.54   EOSABS 0.10 0.12 0.14      BMP LFTs   Recent Labs     02/13/22  0000 02/13/22  0433 02/13/22  0812   SODIUM 137 138 138   POTASSIUM  --  3.3*  --    CHLORIDE  --  102  --    CO2  --  24  --    BUN  --  89*  --    CREATININE  --  8.15*  --    ANIONGAP  --  12  --    BUNCRRATIO  --  11  --    GFR  --  7*  --    CALCIUM  --  8.0*  --    MAGNESIUM  --  1.8  --    PHOSPHORUS  --  4.7*  --     No results found for this encounter   CoAgs Blood Gas:   No results found for this encounter No results found for this encounter       Cardiac Markers Lipid Panel   No results for input(s): "TROPONINI", "CKMB", "MBINDEX", "BNP" in the last 72 hours. No results found for this encounter     IMAGING STUDIES:         PATHOLOGY & MICROBIOLOGY:    No results found for any visits on 02/08/22 (from the past 24 hour(s)).    INPATIENT MEDICATIONS:  acetaminophen (TYLENOL) tablet, 650 mg, Oral, Q6H PRN  calcium carbonate (TUMS) 500mg  (200mg  elemental calcium) chewable tablet, 500 mg, Oral, 3x/day PRN  D5W 250 mL flush bag, , Intravenous, Q15 Min PRN  famotidine (PEPCID) tablet, 20  mg, Oral, Daily  [Held by provider] furosemide (LASIX) 10 mg/mL injection, 20 mg, Intravenous, Daily  heparin 5,000 unit/mL injection, 5,000 Units, Subcutaneous, Q8HRS  NS 250 mL flush bag, , Intravenous, Q15 Min PRN  NS flush syringe, 2-6 mL, Intracatheter, Q8HRS  NS flush syringe, 2-6 mL, Intracatheter, Q1 MIN PRN  oxyBUTYnin (DITROPAN) tablet, 5 mg, Oral, 3x/day  polyethylene glycol (MIRALAX) oral packet, 34 g, Oral, Daily  rosuvastatin (CRESTOR) tablet, 10 mg, Oral, QPM  sennosides-docusate sodium (SENOKOT-S) 8.6-50mg  per tablet, 2 Tablet, Oral, 2x/day  SSIP insulin lispro (HumaLOG) 100 units/mL injection, 0-12 Units, Subcutaneous, 4x/day PRN  [Held by provider] tamsulosin (FLOMAX) capsule, 0.4 mg, Oral, Daily after Dinner         ASSESSMENT:     Travis [name] is a 61 y.o. male with PMH of recurrent nephrolithiasis, HTN, DMII, gout. He presented as a transfer from [place] for obstructive uropathy and hyperkalemia. Workup at [facility] revealed a 9 mm right ureteral stone with severe HUN and resultant acute renal failure with a creatinine of 12.6, BUN 77, potassium of 5.4 and a bicarb of 12. Upon arrival to [facility] his hyperkalemia had worsened to 6.1 while the remainder of his renal parameters remained largely the same. Urology was consulted who urgently took the patient to the OR for rigid cystourethroscopy, retrograde pyelography and bilateral double-J ureteral stent  placement. Pt producing urine and is stable for transfer to [facility] on 12/22. Nephrology following given concern for post-obstructive AKI/ATN w/ significantly elevated Cr still approx 10s. Will need to determine when pt can be safely DC w/ outpatient repeat BMP.    Active Hospital Problems    Diagnosis    Primary Problem: Obstructive uropathy        PLAN:   Obstructive acute kidney injury 2/2 bilateral obstructing stones with HUN s/p BL ureteral double J stent placement - improving  Hx of recurrent nephrolithiasis  Asymptomatic hyperkalemia - resolved  HAGMA 2/2 lactic acidosis and elevated BUN - resolved  HTN  Asymptomatic hyponatremia - resolved  - urology consulted; appreciate recs              - s/p stent placement              - f/u in 2-4 weeks for b/l ureteroscopy              - oxybutynin  - foley removed; serial PVR  - nephrology consulted; appreciate recs  - s/p lokelma and bicarb gtt  - VBG stabilized, no longer trending  - renal U/S ordered for re-evaluation; pending read  - pt w/ mild hyponatremia, s/p 75 cc/hr of NS w/ resolution  - urine lytes ordered  - avoid nephrotoxins              - home lisinopril held   - NaCl maintenance fluid per nephro    Nocturnal O2 need  Concern for sleep apnea  - pt never evaluated for OSA  - will plan for outpatient sleep study    - Chronic Medical Conditions Managed During This Admission -   HLD - home Crestor  DMII - hold hold oral antiglycemics, on conservative SSI  GERD - Pepcid, Tums   ___    DVT/PE Prophylaxis - Heparin  Consults - nephrology and urology  Hardware (Lines, Drains, Foley, Tubes) - PIV  Diet - Renal  Activity - Up w/ assistance and Up in chair TID w/ all meals  Therapy - PT/OT    ANTICIPATED DISPOSITION PLANNING  Needs - TBD  Location - TBD    Abbie Sons, MD 02/13/2022 11:14  PGY-2, Internal Medicine    I saw and examined the patient.  I reviewed Dr. Nicky Pugh note.  I agree with the findings and plan of care as documented in Dr. Nicky Pugh note.  Any  exceptions/additions are edited/noted.    Asencion Noble, MD 16:22 02/13/2022  Hospitalist  [facility]

## 2022-02-13 NOTE — Nurses Notes (Addendum)
[id number] Glo Herring, R. Pt serum K = 3.3, Phos = 4.7, Calcium = 8.0 this am. Urine is also pink tinged since he had surgery, not sure if this was expected for him? Linna Hoff RN [phone]    Service paged

## 2022-02-14 ENCOUNTER — Other Ambulatory Visit: Payer: Self-pay

## 2022-02-14 DIAGNOSIS — E875 Hyperkalemia: Secondary | ICD-10-CM

## 2022-02-14 DIAGNOSIS — K5900 Constipation, unspecified: Secondary | ICD-10-CM

## 2022-02-14 DIAGNOSIS — N281 Cyst of kidney, acquired: Secondary | ICD-10-CM

## 2022-02-14 DIAGNOSIS — I498 Other specified cardiac arrhythmias: Secondary | ICD-10-CM

## 2022-02-14 DIAGNOSIS — N200 Calculus of kidney: Secondary | ICD-10-CM

## 2022-02-14 DIAGNOSIS — N1339 Other hydronephrosis: Secondary | ICD-10-CM

## 2022-02-14 DIAGNOSIS — K219 Gastro-esophageal reflux disease without esophagitis: Secondary | ICD-10-CM

## 2022-02-14 DIAGNOSIS — N179 Acute kidney failure, unspecified: Secondary | ICD-10-CM

## 2022-02-14 DIAGNOSIS — E785 Hyperlipidemia, unspecified: Secondary | ICD-10-CM

## 2022-02-14 DIAGNOSIS — Z79899 Other long term (current) drug therapy: Secondary | ICD-10-CM

## 2022-02-14 DIAGNOSIS — I10 Essential (primary) hypertension: Secondary | ICD-10-CM

## 2022-02-14 DIAGNOSIS — E119 Type 2 diabetes mellitus without complications: Secondary | ICD-10-CM

## 2022-02-14 DIAGNOSIS — Z794 Long term (current) use of insulin: Secondary | ICD-10-CM

## 2022-02-14 DIAGNOSIS — N202 Calculus of kidney with calculus of ureter: Secondary | ICD-10-CM

## 2022-02-14 LAB — ECG 12-LEAD
Atrial Rate: 65 {beats}/min
Calculated P Axis: 51 degrees
Calculated R Axis: 45 degrees
Calculated T Axis: -16 degrees
PR Interval: 118 ms
QRS Duration: 90 ms
QT Interval: 418 ms
QTC Calculation: 434 ms
Ventricular rate: 65 {beats}/min

## 2022-02-14 LAB — BASIC METABOLIC PANEL
ANION GAP: 12 mmol/L (ref 4–13)
BUN/CREA RATIO: 12 (ref 6–22)
BUN: 75 mg/dL — ABNORMAL HIGH (ref 8–25)
CALCIUM: 8.6 mg/dL (ref 8.6–10.3)
CHLORIDE: 103 mmol/L (ref 96–111)
CO2 TOTAL: 24 mmol/L (ref 23–31)
CREATININE: 6.24 mg/dL — ABNORMAL HIGH (ref 0.75–1.35)
ESTIMATED GFR - MALE: 10 mL/min/BSA — ABNORMAL LOW (ref >=60)
GLUCOSE: 172 mg/dL — ABNORMAL HIGH (ref 65–125)
POTASSIUM: 3.5 mmol/L (ref 3.5–5.1)
SODIUM: 139 mmol/L (ref 136–145)

## 2022-02-14 LAB — POC BLOOD GLUCOSE (RESULTS)
GLUCOSE, POC: 182 mg/dl — ABNORMAL HIGH (ref 70–105)
GLUCOSE, POC: 183 mg/dl — ABNORMAL HIGH (ref 70–105)
GLUCOSE, POC: 246 mg/dl — ABNORMAL HIGH (ref 70–105)
GLUCOSE, POC: 153 mg/dl — ABNORMAL HIGH (ref 70–105)
GLUCOSE, POC: 159 mg/dl — ABNORMAL HIGH (ref 70–105)

## 2022-02-14 LAB — MAGNESIUM: MAGNESIUM: 1.7 mg/dL — ABNORMAL LOW (ref 1.8–2.6)

## 2022-02-14 LAB — PHOSPHORUS: PHOSPHORUS: 3.7 mg/dL (ref 2.3–4.0)

## 2022-02-14 MED ORDER — MINERAL OIL ENEMA
133.0000 mL | ENEMA | Freq: Once | RECTAL | Status: DC | PRN
Start: 2022-02-14 — End: 2022-02-15

## 2022-02-14 MED ORDER — MAGNESIUM SULFATE 4 GRAM/100 ML (4 %) IN WATER INTRAVENOUS PIGGYBACK
4.0000 g | INJECTION | Freq: Once | INTRAVENOUS | Status: AC
Start: 2022-02-14 — End: 2022-02-14
  Administered 2022-02-14: 0 g via INTRAVENOUS
  Administered 2022-02-14: 4 g via INTRAVENOUS
  Filled 2022-02-14: qty 100

## 2022-02-14 MED ORDER — CHLORPROMAZINE 25 MG TABLET
25.0000 mg | ORAL_TABLET | Freq: Once | ORAL | Status: AC
Start: 2022-02-14 — End: 2022-02-14
  Administered 2022-02-14: 25 mg via ORAL
  Filled 2022-02-14: qty 1

## 2022-02-14 MED ORDER — LACTULOSE 20 GRAM/30 ML ORAL SOLUTION
15.0000 mL | ORAL | Status: DC
Start: 2022-02-14 — End: 2022-02-14
  Administered 2022-02-14: 0 mL via ORAL
  Administered 2022-02-14: 15 mL via ORAL
  Filled 2022-02-14 (×2): qty 30

## 2022-02-14 NOTE — Progress Notes (Signed)
[facility]  Medicine Progress Note  Full Code    Travis [name]  Date of service: 02/15/2022    LOS: 7   Subjective: Patient feels well this morning, continues to urinate as tolerated. No dysuria, hematuria. Denies chest pain, shortness of breath, abdominal pain.      I/O:  I/O last 24 hours:    Intake/Output Summary (Last 24 hours) at 02/15/2022 1239  Last data filed at 02/15/2022 1048  Gross per 24 hour   Intake 1610 ml   Output 1770 ml   Net -160 ml       acetaminophen (TYLENOL) tablet, 650 mg, Oral, Q6H PRN  bisacodyl (DULCOLAX) rectal suppository, 10 mg, Rectal, Once PRN  calcium carbonate (TUMS) 500mg  (200mg  elemental calcium) chewable tablet, 500 mg, Oral, 3x/day PRN  D5W 250 mL flush bag, , Intravenous, Q15 Min PRN  famotidine (PEPCID) tablet, 20 mg, Oral, Daily  [Held by provider] furosemide (LASIX) 10 mg/mL injection, 20 mg, Intravenous, Daily  heparin 5,000 unit/mL injection, 5,000 Units, Subcutaneous, Q8HRS  mineral oil (FLEET) RETENTION ENEMA, 133 mL, Rectal, Once PRN  NS 250 mL flush bag, , Intravenous, Q15 Min PRN  NS flush syringe, 2-6 mL, Intracatheter, Q8HRS  NS flush syringe, 2-6 mL, Intracatheter, Q1 MIN PRN  oxyBUTYnin (DITROPAN) tablet, 5 mg, Oral, 3x/day  polyethylene glycol (MIRALAX) oral packet, 34 g, Oral, Daily  rosuvastatin (CRESTOR) tablet, 10 mg, Oral, QPM  sennosides-docusate sodium (SENOKOT-S) 8.6-50mg  per tablet, 2 Tablet, Oral, 2x/day  SSIP insulin lispro (HumaLOG) 100 units/mL injection, 0-12 Units, Subcutaneous, 4x/day PRN  [Held by provider] tamsulosin (FLOMAX) capsule, 0.4 mg, Oral, Daily after Dinner        No Known Allergies    Physical Exam:    Vitals:    02/15/22 0300 02/15/22 0539 02/15/22 0700 02/15/22 1100   BP: (!) 143/79  132/74 135/73   Pulse: 62  77 77   Resp: 16  16 16    Temp: 37.2 C (98.9 F)  36.6 C (97.8 F) 37.2 C (98.9 F)   SpO2: 94%  92% 95%   Weight:  104 kg (229 lb 11.5 oz)     Height:       BMI:                 General: 61 y.o. male  in no  acute distress  HEENT:Normocephalic, atraumatic. Sclerae non-icteric, conjunctivae clear. Mucous membranes moist  Cardiovascular: RRR. No murmurs, rubs, gallops.  Respiratory: CTABL. No use of accessory muscles  GI: Abdomen soft, non-distended, non-tender to palpation. Bowel sounds normal  Extremities: No clubbing, cyanosis, or edema  Skin: Warm and dry. No rash visualized on exposed extremities  Neurological: Awake, alert.   Psychiatric: Mood & affect normal and congruent      Labs:  CBC   Recent Labs     02/13/22  0433   WBC 7.3   HGB 10.5*   HCT 29.9*   PLTCNT 173        RBC Indices:   Recent Labs     02/13/22  0433   MCV 89.3   MCH 31.3   MCHC 35.1   MPV 10.1        Diff   Recent Labs     02/13/22  0433   PMNS 82.2   MONOCYTES 7.4   BASOPHILS 0.3  <0.10   PMNABS 6.02   LYMPHSABS 0.57*   EOSABS 0.14   MONOSABS 0.54        BMP  Recent Labs     02/12/22  1635 02/13/22  0000 02/13/22  0433 02/13/22  0812 02/13/22  1510 02/14/22  0505 02/15/22  0517   SODIUM 136 137 138 138 138 139 139   POTASSIUM  --   --  3.3*  --   --  3.5 3.9   CHLORIDE  --   --  102  --   --  103 103   CO2  --   --  24  --   --  24 23   BUN  --   --  89*  --   --  75* 62*   CREATININE  --   --  8.15*  --   --  6.24* 4.78*   ANIONGAP  --   --  12  --   --  12 13   BUNCRRATIO  --   --  11  --   --  12 13   GFR  --   --  7*  --   --  10* 13*        Recent Labs     02/13/22  0433 02/14/22  0505 02/15/22  0517   CALCIUM 8.0* 8.6 8.6   MAGNESIUM 1.8 1.7* 1.5*   PHOSPHORUS 4.7* 3.7 3.5         LFTs   No results for input(s): "AST", "ALT", "ALKPHOS", "TOTBILIRUBIN", "BILIRUBINCON", "TOTALPROTEIN", "ALBUMIN" in the last 72 hours.No results for input(s): "PREALBUMIN", "LIPASE", "UROBILINOGEN", "GAMMAGT", "LDH", "AMYLASE", "AMMONIA" in the last 72 hours.    Invalid input(s): "GGT"     Cardiac Labs   No results for input(s): "TROPONINI", "CKMB", "CPK", "MBINDEX", "BNP" in the last 72 hours.    Invalid input(s): "CRMBKCALC"     Sedimentary   No results for  input(s): "AESR", "CREAPROINFLA", "SEDRATE" in the last 72 hours.     Glucose   Recent Labs     02/13/22  1558 02/13/22  2132 02/14/22  0634 02/14/22  1105 02/14/22  1558 02/14/22  2108 02/14/22  2156 02/15/22  0626 02/15/22  1127   GLUIP 192* 185* 182* 183* 246* 153* 159* 140* 173*        Coagulation   No results for input(s): "INR", "PROTHROMTME", "APTT" in the last 72 hours.    Invalid input(s): "PTT", "PT", "CREACTPROTIE"     Thyroid   No results for input(s): "TSH", "TSHCASCADE", "T4", "T4FREE", "T3", "FREET3" in the last 72 hours.    Invalid input(s): "FTI", "T3UPTAKE", "THYROXINBDGL"     Blood gas   No results found for this encounter     Urinalysis   Recent Labs     02/13/22  0433 02/14/22  0505 02/15/22  0517   GLUCOSE 147* 172* 142*           Radiology:  Results for orders placed or performed during the hospital encounter of 02/08/22 (from the past 72 hour(s))   US KIDNEY                       Status: None    Narrative    CLINICAL INDICATION: re-assessment given continued AKI    TECHNIQUE: Renal sonography was performed.    COMPARISON: 02/09/2022.    INTERPRETATION:     RIGHT KIDNEY:  The right kidney measures 13 cm in length.   There is mild hydronephrosis.  Partially imaged stent.  There is no shadowing renal calculus.  A few cysts measure up to 1.2 cm.  LEFT KIDNEY:  The left kidney measures 13 cm in length.    There is mild hydronephrosis.  Partially imaged stent.  There is no shadowing renal calculus.  1.6 cm cyst.  Few nonobstructive calculi measure up to 0.5 cm.    URINARY BLADDER:  The urinary bladder volume is 381 mL.  Partially imaged stents.      Impression    Improved however persistent mild bilateral hydronephrosis status post stent placement.    Few nonobstructive calculi in the left kidney.    Bilateral renal cysts.                 Microbiology:   No results found for any visits on 02/08/22 (from the past 96 hour(s)).      Assessment/ Plan:     Travis [name] is a 61 y.o. male with  pertinent PMH of  recurrent nephrolithiasis, HTN, DMII, gout. who presented as a transfer from [place] for obstructive uropathy, acute kidney injury and hyperkalemia.    Active Hospital Problems    Diagnosis    Primary Problem: Obstructive uropathy     Obstructive acute kidney injury 2/2 bilateral obstructing stones with HUN s/p BL ureteral double J stent placement  Hx of recurrent nephrolithiasis  HAGMA  Asymptomatic Hyperkalemia  - urology consulted; appreciate recs  - s/p stent placement  - f/u in 2-4 weeks for b/l ureteroscopy  - continue oxybutynin  - nephrology consulted; appreciate recs  - s/p lokelma and bicarb gtt  - renal U/S ordered for re-evaluation showing improved however persistent mild bilateral hydronephrosis status post stent placement w/ few nonobstructive calculi in the left kidney  - will d/c with f/u renal function panel with mag + mag supplements BID  - avoid nephrotoxins, renally dose meds     Asymptomatic hyponatremia  - s/p NS with improvement     Constipation - resolved  - s/p lactulose and enema  - LBM 12/26    Nocturnal O2 need  Concern for sleep apnea  - pt never evaluated for OSA  - will plan for outpatient sleep study     - Chronic Medical Conditions Managed During This Admission -   HLD - home Crestor  HTN - holding lisinopril in the setting of AKI  DMII - conservative SSI  GERD - Pepcid, Tums   ___     DVT/PE Prophylaxis - Heparin  Consults - nephrology and urology  Hardware (Lines, Drains, Foley, Tubes) - PIV  Diet - Renal  Activity - Up w/ assistance and Up in chair TID w/ all meals  Therapy - PT/OT    Ernestene Mention, MD  Internal Medicine, PGY-2    This note may have been partially generated using MModal Fluency Direct system, and there may be some incorrect words, spellings, and punctuation that were not noted in checking the note before saving.    I saw and examined the patient.  I reviewed the resident's note.  I agree with the findings and plan of care as  documented in the resident's note.  Any exceptions/additions are edited/noted.    Bonnita Hollow, DO 02/15/2022, 16:37

## 2022-02-14 NOTE — Care Management Notes (Signed)
[facility]  Care Management Note    Patient Name: Travis [name]  Date of Birth: 07-Aug-1960  Sex: male  Date/Time of Admission: 02/08/2022  4:36 PM  Room/Bed: 16/A  Payor: BLUE CROSS BLUE SHIELD / Plan: [place] [employer] PPO / Product Type: PPO /    LOS: 6 days   Primary Care Providers:  Karlyne Greenspan, NP, NP (General)    Admitting Diagnosis:  Obstructive uropathy [N13.9]    Assessment:      02/14/22 1219   Assessment Details   Assessment Type Continued Assessment   Date of Care Management Update 02/14/22   Date of Next DCP Update 02/17/22   Care Management Plan   Discharge Planning Status plan in progress   Projected Discharge Date 02/15/22   Discharge plan discussed with: Patient   CM will evaluate for rehabilitation potential yes   Patient choice offered to patient/family no   Form for patient choice reviewed/signed and on chart no   Patient aware of possible cost for ambulance transport?  No   Discharge Needs Assessment   Equipment Needed After Discharge other (see comments)  (FWW)   Discharge Facility/Level of Care Needs Home with DME (code 1)   Transportation Available family or friend will provide;car         Discharge Plan:  Home with DME (code 1)  Per Service Pt continues to be monitored at this time and treated by Nephrology. PT rec=Home with FWW. CCC submitted FWW order for signature. CCC sent task via [employer]port to CMA to send referral to [facility]. CCC to follow progress.     The patient will continue to be evaluated for developing discharge needs.     Case Manager: Metta Clines, CASE MANAGER  Phone: [phone]

## 2022-02-14 NOTE — Transitional Care (Addendum)
Hospital discharge follow up appointment is scheduled for 1 week with Charna Busman, NP.  Appointment date and time added or will merge to the AVS upon discharge.  Left message with PCP office nurse to call with appointment date and time, contact information provided.  Levin Erp, LPN, [facility] Service Coordinator Extension [phone]      Incoming call from PCP office, patient scheduled for 02/23/22 at 11:40 am.  AVS updated.  Levin Erp, LPN, [facility] Service Coordinator Extension [phone]

## 2022-02-14 NOTE — Transitional Care (Signed)
[facility] Medicine   Transitional Care Coordination   Initial Assessment       Name: Travis [name]   Date of Birth: 27-Apr-1960 61 y.o.  Date of service: 02/14/2022         Attempted to reach patient to complete initial assessment.  No answer noted at bedside phone currently.  Mobile number not listed.   Levin Erp, LPN, [facility] Service Coordinator Extension [phone]

## 2022-02-14 NOTE — Progress Notes (Signed)
[facility]  INTERNAL MEDICINE  Progress Note     Name: Travis [name], Travis [name], 61 y.o. male Date of Service: 02/14/2022   MRN: [id number] LOS: 6   PCP: Karlyne Greenspan, NP Attending: Azucena Freed*   Code Status: Full Code Admitted for: Obstructive uropathy     SUBJECTIVE:      Experiencing recurrent hiccups. Some abdominal discomfort from constipation, though still passing gas.    OBJECTIVE:    EXAMINATION:  Temperature: 37.3 C (99.1 F) BP (Non-Invasive): (!) 142/80 Heart Rate: 66   Respiratory Rate: 18 SpO2: 95 % Weight: 112 kg (246 lb 14.6 oz)          Intake/Output Summary (Last 24 hours) at 02/14/2022 1231  Last data filed at 02/14/2022 1048  Gross per 24 hour   Intake 1350 ml   Output 2975 ml   Net -1625 ml     Date of Last Bowel Movement:  (PTA)     General:  NAD  Mouth/Throat:  MMM  Cardiovascular:  RRR, no murmurs, rubs, gallops  Respiratory:  CTABL, breathing comfortably on RA  Abdominal:  BS present; abdomen soft, mildly distended.  MSK/Extremities:  No edema  Neurological:  Awake, alert, answering questions appropriately    LABS:    CBC Differential   Recent Labs     02/12/22  0327 02/13/22  0433   WBC 9.4 7.3   HGB 11.0* 10.5*   HCT 31.2* 29.9*   PLTCNT 170 173    Recent Labs     02/12/22  0327 02/13/22  0433   PMNS 83.1 82.2   MONOCYTES 7.6 7.4   BASOPHILS 0.1  <0.10 0.3  <0.10   PMNABS 7.78* 6.02   LYMPHSABS 0.71* 0.57*   MONOSABS 0.71 0.54   EOSABS 0.12 0.14      BMP LFTs   Recent Labs     02/13/22  1510 02/14/22  0505   SODIUM 138 139   POTASSIUM  --  3.5   CHLORIDE  --  103   CO2  --  24   BUN  --  75*   CREATININE  --  6.24*   ANIONGAP  --  12   BUNCRRATIO  --  12   GFR  --  10*   CALCIUM  --  8.6   MAGNESIUM  --  1.7*   PHOSPHORUS  --  3.7    No results found for this encounter   CoAgs Blood Gas:   No results found for this encounter No results found for this encounter      Cardiac Markers Lipid Panel   No results for input(s): "TROPONINI", "CKMB", "MBINDEX", "BNP" in the  last 72 hours. No results found for this encounter     IMAGING STUDIES:         PATHOLOGY & MICROBIOLOGY:    No results found for any visits on 02/08/22 (from the past 24 hour(s)).    INPATIENT MEDICATIONS:  acetaminophen (TYLENOL) tablet, 650 mg, Oral, Q6H PRN  bisacodyl (DULCOLAX) rectal suppository, 10 mg, Rectal, Once PRN  calcium carbonate (TUMS) 500mg  (200mg  elemental calcium) chewable tablet, 500 mg, Oral, 3x/day PRN  D5W 250 mL flush bag, , Intravenous, Q15 Min PRN  famotidine (PEPCID) tablet, 20 mg, Oral, Daily  [Held by provider] furosemide (LASIX) 10 mg/mL injection, 20 mg, Intravenous, Daily  heparin 5,000 unit/mL injection, 5,000 Units, Subcutaneous, Q8HRS  lactulose (ENULOSE) 20g per 44mL oral liquid, 15 mL, Oral, Q2H  NS 250 mL  flush bag, , Intravenous, Q15 Min PRN  NS flush syringe, 2-6 mL, Intracatheter, Q8HRS  NS flush syringe, 2-6 mL, Intracatheter, Q1 MIN PRN  oxyBUTYnin (DITROPAN) tablet, 5 mg, Oral, 3x/day  polyethylene glycol (MIRALAX) oral packet, 34 g, Oral, Daily  rosuvastatin (CRESTOR) tablet, 10 mg, Oral, QPM  sennosides-docusate sodium (SENOKOT-S) 8.6-50mg  per tablet, 2 Tablet, Oral, 2x/day  SSIP insulin lispro (HumaLOG) 100 units/mL injection, 0-12 Units, Subcutaneous, 4x/day PRN  [Held by provider] tamsulosin (FLOMAX) capsule, 0.4 mg, Oral, Daily after Dinner         ASSESSMENT:     Travis [name] is a 61 y.o. male with PMH of recurrent nephrolithiasis, HTN, DMII, gout. He presented as a transfer from [place] for obstructive uropathy and hyperkalemia. Workup at [facility] revealed a 9 mm right ureteral stone with severe HUN and resultant acute renal failure with a creatinine of 12.6, BUN 77, potassium of 5.4 and a bicarb of 12. Upon arrival to [facility] his hyperkalemia had worsened to 6.1 while the remainder of his renal parameters remained largely the same. Urology was consulted who urgently took the patient to the OR for rigid cystourethroscopy, retrograde pyelography and  bilateral double-J ureteral stent placement. Pt producing urine and is stable for transfer to [facility] on 12/22. Nephrology following given concern for post-obstructive AKI/ATN. Cr initially w/ slow improvement, now improving as pt continues to have robust output and good intake. Will need to determine when pt can be safely DC w/ outpatient repeat BMP.    Active Hospital Problems    Diagnosis    Primary Problem: Obstructive uropathy        PLAN:   Obstructive acute kidney injury 2/2 bilateral obstructing stones with HUN s/p BL ureteral double J stent placement - improving  Hx of recurrent nephrolithiasis  Asymptomatic hyperkalemia - resolved  HAGMA 2/2 lactic acidosis and elevated BUN - resolved  HTN  Asymptomatic hyponatremia - resolved  - urology consulted; appreciate recs              - s/p stent placement              - f/u in 2-4 weeks for b/l ureteroscopy              - oxybutynin  - foley removed; serial PVR  - nephrology consulted; appreciate recs  - s/p lokelma and bicarb gtt  - VBG stabilized, no longer trending  - renal U/S ordered for re-evaluation: "Improved however persistent mild bilateral hydronephrosis status post stent placement. Few nonobstructive calculi in the left kidney. Bilateral renal cysts."  - pt w/ mild hyponatremia, s/p 75 cc/hr of NS w/ resolution  - urine lytes ordered  - avoid nephrotoxins              - home lisinopril held    Constipation  - pt has not had a BM since prior to 12/20  - aggressive bowel reg  - 1x dulcolax suppository prn  - lactulose q2h challenge    Nocturnal O2 need  Concern for sleep apnea  - pt never evaluated for OSA  - will plan for outpatient sleep study    - Chronic Medical Conditions Managed During This Admission -   HLD - home Crestor  DMII - hold hold oral antiglycemics, on conservative SSI  GERD - Pepcid, Tums   ___    DVT/PE Prophylaxis - Heparin  Consults - nephrology and urology  Hardware (Lines, Drains, Foley, Tubes) - PIV  Diet - Renal  Activity -  Up w/  assistance and Up in chair TID w/ all meals  Therapy - PT/OT    ANTICIPATED DISPOSITION PLANNING   Needs - TBD  Location - TBD    Abbie Sons, MD 02/14/2022 12:31  PGY-2, Internal Medicine    I saw and examined the patient.  I reviewed Dr. Nicky Pugh note.  I agree with the findings and plan of care as documented in Dr. Nicky Pugh note.  Any exceptions/additions are edited/noted.    Asencion Noble, MD 17:12 02/14/2022  Hospitalist  [facility]

## 2022-02-14 NOTE — Discharge Instructions (Addendum)
If you have any questions regarding your discharge instructions, please call [phone] ([phone]). Our transitions nurses are available to assist you Monday thru Friday from 8 am-4:30 pm.  If this is after 4:30 pm, a weekend, or holiday please call and ask to speak to the doctor on call for the Hospitalist team.  In case of an emergency please call 911.

## 2022-02-15 ENCOUNTER — Other Ambulatory Visit: Payer: Self-pay

## 2022-02-15 DIAGNOSIS — M109 Gout, unspecified: Secondary | ICD-10-CM

## 2022-02-15 DIAGNOSIS — E871 Hypo-osmolality and hyponatremia: Secondary | ICD-10-CM

## 2022-02-15 DIAGNOSIS — N132 Hydronephrosis with renal and ureteral calculous obstruction: Secondary | ICD-10-CM

## 2022-02-15 LAB — BASIC METABOLIC PANEL
ANION GAP: 13 mmol/L (ref 4–13)
BUN/CREA RATIO: 13 (ref 6–22)
BUN: 62 mg/dL — ABNORMAL HIGH (ref 8–25)
CALCIUM: 8.6 mg/dL (ref 8.6–10.3)
CHLORIDE: 103 mmol/L (ref 96–111)
CO2 TOTAL: 23 mmol/L (ref 23–31)
CREATININE: 4.78 mg/dL — ABNORMAL HIGH (ref 0.75–1.35)
ESTIMATED GFR - MALE: 13 mL/min/BSA — ABNORMAL LOW (ref >=60)
GLUCOSE: 142 mg/dL — ABNORMAL HIGH (ref 65–125)
POTASSIUM: 3.9 mmol/L (ref 3.5–5.1)
SODIUM: 139 mmol/L (ref 136–145)

## 2022-02-15 LAB — MAGNESIUM: MAGNESIUM: 1.5 mg/dL — ABNORMAL LOW (ref 1.8–2.6)

## 2022-02-15 LAB — POC BLOOD GLUCOSE (RESULTS)
GLUCOSE, POC: 140 mg/dl — ABNORMAL HIGH (ref 70–105)
GLUCOSE, POC: 173 mg/dl — ABNORMAL HIGH (ref 70–105)

## 2022-02-15 LAB — PHOSPHORUS: PHOSPHORUS: 3.5 mg/dL (ref 2.3–4.0)

## 2022-02-15 MED ORDER — TAMSULOSIN 0.4 MG CAPSULE
0.4000 mg | ORAL_CAPSULE | Freq: Every evening | ORAL | 0 refills | Status: DC
Start: 2022-02-15 — End: 2022-03-21
  Filled 2022-02-15: qty 30, 30d supply, fill #0

## 2022-02-15 MED ORDER — MAGNESIUM OXIDE 400 MG (241.3 MG MAGNESIUM) TABLET
400.0000 mg | ORAL_TABLET | Freq: Two times a day (BID) | ORAL | 0 refills | Status: AC
Start: 2022-02-15 — End: ?
  Filled 2022-02-15: qty 60, 30d supply, fill #0

## 2022-02-15 MED ORDER — MAGNESIUM SULFATE 4 GRAM/100 ML (4 %) IN WATER INTRAVENOUS PIGGYBACK
4.0000 g | INJECTION | Freq: Once | INTRAVENOUS | Status: AC
Start: 2022-02-15 — End: 2022-02-15
  Administered 2022-02-15: 0 g via INTRAVENOUS
  Administered 2022-02-15: 4 g via INTRAVENOUS
  Filled 2022-02-15: qty 100

## 2022-02-15 NOTE — Care Plan (Signed)
Problem: Adult Inpatient Plan of Care  Goal: Plan of Care Review  Outcome: Ongoing (see interventions/notes)  Goal: Patient-Specific Goal (Individualized)  Outcome: Ongoing (see interventions/notes)  Flowsheets (Taken 02/14/2022 2000)  Individualized Care Needs: OOB W/T per report  Anxieties, Fears or Concerns: None  Patient-Specific Goals (Include Timeframe): d/c home  Plan of Care Reviewed With: patient  Goal: Absence of Hospital-Acquired Illness or Injury  Outcome: Ongoing (see interventions/notes)  Intervention: Identify and Manage Fall Risk  Recent Flowsheet Documentation  Taken 02/14/2022 2000 by Elyn Aquas, RN  Safety Promotion/Fall Prevention: activity supervised  Intervention: Prevent Skin Injury  Recent Flowsheet Documentation  Taken 02/14/2022 2000 by Elyn Aquas, RN  Skin Protection:   adhesive use limited   tubing/devices free from skin contact   transparent dressing maintained  Intervention: Prevent and Manage VTE (Venous Thromboembolism) Risk  Recent Flowsheet Documentation  Taken 02/14/2022 2242 by Elyn Aquas, RN  VTE Prevention/Management: ambulation promoted  Taken 02/14/2022 2000 by Elyn Aquas, RN  VTE Prevention/Management: ambulation promoted  Intervention: Prevent Infection  Recent Flowsheet Documentation  Taken 02/14/2022 2000 by Elyn Aquas, RN  Infection Prevention: barrier precautions utilized  Goal: Optimal Comfort and Wellbeing  Outcome: Ongoing (see interventions/notes)  Intervention: Provide Person-Centered Care  Recent Flowsheet Documentation  Taken 02/14/2022 2000 by Elyn Aquas, RN  Trust Relationship/Rapport:   care explained   choices provided   emotional support provided   empathic listening provided   questions answered   questions encouraged   reassurance provided   thoughts/feelings acknowledged  Goal: Rounds/Family Conference  Outcome: Ongoing (see interventions/notes)     Problem: Fall Injury Risk  Goal: Absence of Fall and  Fall-Related Injury  Outcome: Ongoing (see interventions/notes)  Intervention: Identify and Manage Contributors  Recent Flowsheet Documentation  Taken 02/14/2022 2000 by Elyn Aquas, RN  Self-Care Promotion: independence encouraged  Intervention: Promote Injury-Free Environment  Recent Flowsheet Documentation  Taken 02/14/2022 2000 by Elyn Aquas, RN  Safety Promotion/Fall Prevention: activity supervised     Problem: Skin Injury Risk Increased  Goal: Skin Health and Integrity  Outcome: Ongoing (see interventions/notes)  Intervention: Optimize Skin Protection  Recent Flowsheet Documentation  Taken 02/14/2022 2000 by Elyn Aquas, RN  Pressure Reduction Techniques: Frequent weight shifting encouraged  Pressure Reduction Devices: Repositioning wedges/pillows utilized  Skin Protection:   adhesive use limited   tubing/devices free from skin contact   transparent dressing maintained     Problem: Acute Rehab Services Goal & Intervention Plan  Goal: Bathing Goal  Description: Stand Alone Therapy Goal  Outcome: Ongoing (see interventions/notes)  Goal: Bed Mobility Goal  Description: Stand Alone Therapy Goal  Outcome: Ongoing (see interventions/notes)  Goal: Caregiver Training Goal  Description: Stand Alone Therapy Goal  Outcome: Ongoing (see interventions/notes)  Goal: Cognition Goal  Description: Stand Alone Therapy Goal  Outcome: Ongoing (see interventions/notes)  Goal: Cognition Goals, SLP  Description: Stand Alone Therapy Goal  Outcome: Ongoing (see interventions/notes)  Goal: Communication Goals, SLP  Description: Stand Alone Therapy Goal  Outcome: Ongoing (see interventions/notes)  Goal: Dysphagia Goals, SLP  Description: Stand Alone Therapy Goal  Outcome: Ongoing (see interventions/notes)  Goal: Eating Self-Feeding Goal  Description: Stand Alone Therapy Goal  Outcome: Ongoing (see interventions/notes)  Goal: Gait Training Goal  Description: Stand Alone Therapy Goal  Outcome: Ongoing (see  interventions/notes)  Goal: Grooming Goal  Description: Stand Alone Therapy Goal  Outcome: Ongoing (see interventions/notes)  Goal: Home Management Goal  Description: Stand Alone Therapy Goal  Outcome: Ongoing (see interventions/notes)  Goal: Interprofessional Goal  Description: Stand Alone Therapy Goal  Outcome: Ongoing (see interventions/notes)  Goal: LB Dressing Goal  Description: Stand Alone Therapy Goal  Outcome: Ongoing (see interventions/notes)  Goal: Occupational Therapy Goals  Description: Stand Alone Therapy Goal  Outcome: Ongoing (see interventions/notes)  Goal: Physical Therapy Goal  Description: Stand Alone Therapy Goal  Outcome: Ongoing (see interventions/notes)  Goal: Range of Motion Goal  Description: Stand Alone Therapy Goal  Outcome: Ongoing (see interventions/notes)  Goal: Strength Goal  Description: Stand Alone Therapy Goal  Outcome: Ongoing (see interventions/notes)  Goal: Toileting Goal  Description: Stand Alone Therapy Goal  Outcome: Ongoing (see interventions/notes)  Goal: Goal Transfer Training  Description: Stand Alone Therapy Goal  Outcome: Ongoing (see interventions/notes)  Goal: UB Dressing Goal  Description: Stand Alone Therapy Goal  Outcome: Ongoing (see interventions/notes)

## 2022-02-15 NOTE — Nurses Notes (Signed)
Patient discharged home with family.  AVS reviewed with patient/care giver.  A written copy of the AVS and discharge instructions was given to the patient/care giver.  Questions sufficiently answered as needed.  Patient/care giver encouraged to follow up with PCP as indicated.  In the event of an emergency, patient/care giver instructed to call 911 or go to the nearest emergency room.

## 2022-02-15 NOTE — Transitional Care (Signed)
Hospital discharge follow up appointment is scheduled for 02-23-22 at 11:40 am with Charna Busman, NP.  Appointment date and time added or will merge to the AVS upon discharge.  Jeanella Flattery, MA  Transition Care Assistant  Ext. [phone]  02/15/2022

## 2022-02-15 NOTE — Discharge Summary (Signed)
[facility]  DISCHARGE SUMMARY    PATIENT NAME:  Danish, Ruffins  MRN:  [id number]  DOB:  [birth year]    ENCOUNTER DATE:  02/08/2022  INPATIENT ADMISSION DATE: 02/08/2022  DISCHARGE DATE:  02/15/2022    ATTENDING PHYSICIAN: Colin Broach, DO  SERVICE: HOSPITALIST 1  PRIMARY CARE PHYSICIAN: Karlyne Greenspan, NP   Has PCP been verified with patient and updated? Yes    Lay Caregiver Name: Nicolaus Andel Caregiver Contact Number: [phone]   Lay Caregiver Relationship to patient: spouse/significant other    PRIMARY DISCHARGE DIAGNOSIS: Obstructive uropathy  Active Hospital Problems    Diagnosis Date Noted    Principal Problem: Obstructive uropathy [N13.9] 02/08/2022      Resolved Hospital Problems   No resolved problems to display.     There are no active non-hospital problems to display for this patient.          Current Discharge Medication List        START taking these medications.        Details   ciprofloxacin HCl 500 mg Tablet  Commonly known as: CIPRO   Take 1 Tablet (500 mg total) by mouth once daily for 5 days  Qty: 5 Tablet  Refills: 0     magnesium oxide 400 mg Tablet  Commonly known as: MAG-OX   400 mg, Oral, 2 TIMES DAILY  Qty: 60 Tablet  Refills: 0     tamsulosin 0.4 mg Capsule  Commonly known as: FLOMAX   0.4 mg, Oral, EVERY EVENING AFTER DINNER  Qty: 30 Capsule  Refills: 0            CONTINUE these medications - NO CHANGES were made during your visit.        Details   ascorbic acid (vitamin C) 500 mg Tablet  Commonly known as: VITAMIN C   500 mg, Oral, DAILY  Refills: 0     cholecalciferol (vitamin D3) 50 mcg (2,000 unit) Tablet   1 Capsule, Oral, DAILY  Refills: 0     Janumet XR 50-1,000 mg Tab, Multiphasic Release 24 hr  Generic drug: SITagliptin phos-metformin   1 Tablet, Oral, 2 TIMES DAILY  Refills: 0     pioglitazone 45 mg Tablet  Commonly known as: ACTOS   45 mg, Oral, DAILY  Refills: 0     rosuvastatin 10 mg Tablet  Commonly known as: CRESTOR   10 mg, Oral, EVERY  EVENING  Refills: 0     Uloric 80 mg Tablet  Generic drug: Febuxostat   80 mg, Oral, DAILY  Refills: 0            STOP taking these medications.      lisinopriL 20 mg Tablet  Commonly known as: PRINIVIL            Discharge med list refreshed?  YES     No Known Allergies  HOSPITAL PROCEDURE(S):   No orders of the defined types were placed in this encounter.    Surgical/Procedural Cases on this Admission       Case IDs Date Procedure Surgeon Location Status    [id number] 02/08/22 CYSTOSCOPY WITH URETERAL STENT INSERTION Andres Shad, MD  [facility]          REASON FOR HOSPITALIZATION AND HOSPITAL COURSE   BRIEF HPI:  This is a 61 y.o., male with a past medical history of recurrent nephrolithiasis, hypertension, type 2 diabetes, gout who presented as a transfer  from [place] for obstructive uropathy and hyperkalemia.  Workup revealed a 9 mm right ureteral stone and severe hydronephrosis resulting in acute renal failure with a creatinine of 12.6.     BRIEF HOSPITAL NARRATIVE:   Upon his arrival to [facility] his hyperkalemia had worsened.  Urology was consulted who urgently took the patient to the OR for rigid cystourethroscopy, retrograde pyelography, and bilateral double-J ureteral stent placement.  Patient began producing urine and his acute kidney injury (post-obstructive) showed improvement of creatinine over the oncoming days.  The patient will follow-up with urology in 2-4 weeks for removal of stents, and lithotripsy.  A follow-up renal function test with magnesium ordered as an outpatient to continue to monitor creatinine with PCP.    TRANSITION/POST DISCHARGE CARE/PENDING TESTS/REFERRALS:   Follow-up with PCP on 1/4:  Please follow-up on renal function panel with magnesium.  Patient's lisinopril was held upon discharge due to ongoing postobstructive AKI, please determine when restart given history of hypertension.  The patient will also be discharged on Flomax given obstructive  uropathy.    Follow-up with Urology for lithotripsy, stent removal    CONDITION ON DISCHARGE:  A. Ambulation: Full ambulation  B. Self-care Ability: Complete  C. Cognitive Status Oriented x 3  D. Code status at discharge: full      LINES/DRAINS/WOUNDS AT DISCHARGE:   Patient Lines/Drains/Airways Status       Active Line / Dialysis Catheter / Dialysis Graft / Drain / Airway / Wound       Name Placement date Placement time Site Days    Peripheral IV Extended dwell catheter;Ultrasound guided Right;Lower Basilic  (medial side of arm) 02/11/22  1830  -- 3                    DISCHARGE DISPOSITION:  Home discharge  DISCHARGE INSTRUCTIONS:  Post-Discharge Follow Up Appointments       Thursday Feb 23, 2022    Go to Karlyne Greenspan, NP    Phone: [phone]    Where: [address], [facility] [address]             RENAL FUNCTION PANEL     Release to patient Automated      MAGNESIUM     DISCHARGE INSTRUCTION - MISC    You have been prescribed a 5 day course of ciprofloxacin to begin taking 5 days prior to your urologic intervention.    About 50% of patients will have some type of side-effect associated with their stent. It is not possible to predict who will have stent-associated difficulties or when the stent symptoms will resolve. Some patients have stent symptoms for just a few days, while others find their symptoms persist throughout their entire stent duration. You may work normally while a stent is in place, but with more physically strenuous occupations you may experience more symptoms.  Ureteral stent symptoms may include:     Flank Pain - Pain may be felt in the back (loin), bladder area, groin, or urethra. For some the pain may be so severe that it feels like you are passing a kidney stone.  The discomfort or pain may be more noticeable after physical activities and during/after urination.      Hematuria - Stents can cause blood to appear in the urine at various times. Usually, physical activity of one  kind or other results in movement of the stent inside the body. This can give rise to blood in the urine. Blood in  the urine is like food coloring, where a couple of drops of blood can cause your urine to be red.  You may even pass small blood clots in your urine. If you are unable to urinate due to large clots contact our office or go the emergency room.     Bladder Spasms - The stent can rub and irritate the lining of the bladder, making it feel like you need to urinate more frequently during the day and at night. These symptoms can occasionally be improved by medications (oxybutynin and Flomax).     Incontinence - Rarely, a stent may cause such bladder spasms leading to urinary leakage. This can usually be controlled with oral medications or with stent removal. If you have continues urine leakage call our office.      Stent Migration - Stents may move from their intended positions to other parts of the urinary tract, causing pain or incontinence.  If there is a string dangling from your urethra from the stent then do not have sexual activity as this could dislodge the stent.  If there is no string, then there is no restrictions on sexual activity, but this could increase stent related discomfort.      Encrustation - Stents may be forgotten by patients and their care-givers. Over time, they can become coated with urinary salts and minerals and eventually become one very large calcified stone. This may lead to chronic obstruction, pain, chronic infections, or even complete atrophy (death) of that kidney. Typically, 2-3 procedures are necessary to remove these calcified stents.     DME - WALKER Front Wheeled    Please note - If patient is 300 lbs or greater please order bariatric or heavy duty items.     Ht 188 cm    Wt 112 kg    Current Attending: Asencion Noble, MD    Medical Condition or Diagnosis which is primary reason for equipment: Generalized weakness, gait instability, fall risk    Patient has mobility  limits that significantly impairs ability to participate in one or more mobility related ADL's (MRADL's): Yes    Moblity Limitations: Pt at heightened risk of injury r/t attempts to fulfill MRADL's & can safely use walker which resolves issue    Walker Type: Front Wheeled    Freedom of Choice: I have informed patient of their freedom of choice with respect to DME providers    Estimated Length of Need (in months; 99 mo.= lifetime) 99           Ernestene Mention, MD    Copies sent to Care Team         Relationship Specialty Notifications Start End    Karlyne Greenspan, NP PCP - General NURSE PRACTITIONER  02/08/22     Phone: [phone] Fax: [phone]         [address] [facility] [address]            Referring providers can utilize [email or web address] to access their referred [facility] patient's information.

## 2022-02-15 NOTE — Care Plan (Signed)
Patient Name: Travis [name]  Date of Birth: [birth year]  Height: Height: 188 cm (6\' 2" )  Weight: Weight: 104 kg (229 lb 11.5 oz)  Room/Bed: 16/A [facility]   Physical Therapy  Rehabilitation Services  02/15/2022  Progress Note     Payor: BLUE CROSS BLUE SHIELD / Plan: [place] [employer] [employer] PPO / Product Type: PPO /   02/15/2022 Length of Stay: 7      Assessment   Today pt demonstrated VSS on RA (PIV Line also present). Pt required SBAx1 for bed mobility, along with stand-by assistance for STS transitions and contact guard assist (+walker, front wheeled) to ambulate 75' x 3 with breaks for SOB on level surfaces.     Anticipate D/C to home with outpatient services (OP PT per pt request for endurance/stamina training) when medically appropriate pending activity tolerance and functional performance.     Discharge Needs   Equipment Recommendation: front wheeled walker  The patient presents with mobility limitations due to impaired balance and impaired functional activity tolerance that significantly impair/prevent patient's ability to participate in mobility-related activities of daily living (MRADLs) including  ambulation and transfers in order to safely complete, safely entering/exiting the home, in reasonable time. This functional mobility deficit can be sufficiently resolved with the use of a front wheeled walker  in order to decrease the risk of falls, morbidity, and mortality in performance of these MRADLs.  Patient is able to safely use this assistive device.  Discharge Disposition: home with outpatient services (OP PT per pt request for endurance/stamina training)  JUSTIFICATION OF DISCHARGE RECOMMENDATION - Based on current diagnosis, functional performance prior to admission, and current functional performance, this patient requires continued PT services in home with outpatient services (OP PT per pt request for endurance/stamina training) in order to achieve significant functional improvements in these  deficit areas: aerobic capacity/endurance, muscle performance, neuromotor development and sensory integration, neuromuscular, ergonomics and body mechanics, gait, locomotion, and balance, motor function.     Plan   Current Intervention: balance training, bed mobility training, gait training, patient/family education, ROM (range of motion), strengthening, stretching, transfer training To provide physical therapy services minimum of 1x/week  for duration of until discharge. The risks/benefits of therapy have been discussed with the patient/caregiver and he/she is in agreement with the established plan of care.      Subjective & Objective      02/15/22 0854   Therapist Pager   PT Assigned/ Pager # Oza [place]le [phone]   Rehab Session   Document Type therapy progress note (daily note)   Total PT Minutes: 15   General Information   Patient Profile Reviewed yes   Medical Lines PIV Line   Respiratory Status room air   Existing Precautions/Restrictions full code   Mutuality/Individual Preferences   Individualized Care Needs OOB using FWW + Ax1   Plan of Care Reviewed With patient   Pre Treatment Status   Pre Treatment Patient Status Nurse approved session;Call light within reach;Telephone within reach;Patient supine in bed   Support Present Pre Treatment  None   Communication Pre Treatment  Nurse   Communication Pre Treatment Comment Chart review performed & RN approved session   Cognitive Assessment/Interventions   Behavior/Mood Observations alert;cooperative;behavior appropriate to situation, WNL/WFL   Attention WNL/WFL   Vital Signs   O2 Delivery Pre Treatment room air   O2 Delivery Post Treatment room air   Vitals Comment VSS on RA   Pain Assessment   Pain Intervention  Repositioned   Bed  Mobility Assessment/Treatment   Bed Mobility, Assistive Device Head of Bed Elevated   Supine-Sit Independence stand-by assistance   Safety Issues decreased use of arms for pushing/pulling;decreased use of legs for bridging/pushing;impaired  trunk control for bed mobility   Impairments coordination impaired;endurance;flexibility decreased   Comment SBAx1   Transfer Assessment/Treatment   Sit-Stand Independence stand-by assistance   Stand-Sit Independence stand-by assistance   Sit-Stand-Sit, Assist Device walker, front wheeled   Bed-Chair Independence stand-by assistance   Bed-Chair-Bed Assist Device walker, front wheeled   Transfer Safety Issues step length decreased;sequencing ability decreased   Transfer Impairments balance impaired;coordination impaired;endurance   Transfer Comment SBAx1   Gait Assessment/Treatment   Independence  contact guard assist   Assistive Device  walker, front wheeled   Distance in Feet 75' x 3 with breaks for SOB   Deviations  weight-shifting ability decreased;stride length decreased;step length decreased;cadence decreased;limb motion velocity decreased   Safety Issues  balance decreased during turns;sequencing ability decreased;step length decreased;weight-shifting ability decreased   Impairments  balance impaired;coordination impaired;endurance;flexibility decreased;motor control impaired   Comment CGA + FWW   Balance Skill Training   Comment FWW   Sitting Balance: Static normal balance   Sitting, Dynamic (Balance) normal balance   Sit-to-Stand Balance fair balance   Standing Balance: Static fair - balance   Standing Balance: Dynamic fair - balance   Systems Impairment Contributing to Balance Disturbance musculoskeletal;neuromuscular   Identified Impairments Contributing to Balance Disturbance impaired coordination;impaired motor control   Therapeutic Exercise/Activity   Comment Pt provided with education on importance of getting up to bedside chair with staff during acute stay to maintain/ increase, strength, and endurance. Pt verbalized understanding.   Post Treatment Status   Post Treatment Patient Status Call light within reach;Telephone within reach;Patient safety alarm activated;Patient sitting in bedside chair or  w/c   Support Present Post Treatment  None   Communication Post Treatement Nurse   Communication Post Treatment Comment Updated on performance, positional changes and recommendations for nursing transfers   Plan of Care Review   Plan Of Care Reviewed With patient   Basic Mobility Am-PAC/6Clicks Score (APPROVED Staff)   Turning in bed without bedrails 4   Lying on back to sitting on edge of flat bed 4   Moving to and from a bed to a chair 4   Standing up from chair 4   Walk in room 3   Climbing 3-5 steps with railing 3   6 Clicks Raw Score total 22   Standardized (t-scale) score 47.4   Patient Mobility Goal (JHHLM) 7- Walk 25 feet or more 3X/day   Exercise/Activity Level Performed 7- Walked 25 feet or more   Functional Impairment   Overall Functional Impairments/Problem List balance impaired;coordination impaired;endurance;flexibility decreased;strength decreased   Physical Therapy Clinical Impression   Pathology/Pathophysiology Noted musculoskeletal;neuromuscular   Impairments Found (describe specific impairments) aerobic capacity/endurance;muscle performance;neuromotor development and sensory integration;neuromuscular;ergonomics and body mechanics;gait, locomotion, and balance;motor function   Functional Limitations in Following  self-care;home management;work;community/leisure   Therapy Frequency minimum of 1x/week   Predicted Duration of Therapy Intervention (days/wks) until discharge   Anticipated Equipment Needs at Discharge (PT) front wheeled walker   Anticipated Discharge Disposition home with outpatient services  (OP PT per pt request for endurance/stamina training)   Functional Reporting   Functional Limitations were determined by: Professional clinical judgement   Psychosocial Support   Trust Relationship/Rapport care explained;choices provided;questions answered;questions encouraged;reassurance provided;thoughts/feelings acknowledged       Earl Lites PT, DPT  Doctor of Physical Therapy  Pager  [phone]

## 2022-02-21 ENCOUNTER — Inpatient Hospital Stay (HOSPITAL_COMMUNITY)
Admission: RE | Admit: 2022-02-21 | Discharge: 2022-02-21 | Disposition: A | Discharge status: Home / Self Care | Payer: BC Managed Care – PPO | Source: Ambulatory Visit

## 2022-02-21 ENCOUNTER — Encounter (HOSPITAL_COMMUNITY): Payer: Self-pay

## 2022-02-21 HISTORY — DX: Type 2 diabetes mellitus without complications (CMS HCC): E11.9

## 2022-02-21 HISTORY — DX: Personal history of other diseases of urinary system: Z87.448

## 2022-02-21 HISTORY — DX: Presence of spectacles and contact lenses: Z97.3

## 2022-03-03 NOTE — H&P (Unsigned)
Addendum:  none      [facility]  Department of Anesthesiology  Optimization Visit  History and Physical     [name], Travis, 62 y.o. male Medical Record Number: [id number]   Date of Birth:  [birth year] Date of Service:  03/06/22    Information Obtained from: {HISTORY PROVIDED BY:19225::"patient"} Surgeon:  Dr. Traci Sermon   Date of Surgery: 03/20/22    CHIEF COMPLAINT  Pre-Operative Optimization Visit for ureteroscopy w/ holmium laser [procedure] due to  bilateral ureteral stones [indication].      RECOMMENDATION  SUMMARY: The surgery is considered {Surgery urgency:44989}.  The surgery is considered {Surgery Intensity:44990}.  Recommend  {Surgery recommendation:44991}.    Patient has the following outstanding needs for optimization: {Risk stratification needs:44992}.    Patient would benefit from active incentive spirometer, early ambulation following surgery and multi-modal pain management approach.  DVT prophylaxis as appropriate.  Please see addendum for any updates after 03/03/2022.        ASSESSMENT & PLAN  Preoperative Examination  Ureteral Stones  Diabetes  HLD  MEDICATION INSTRUCTIONS    Take the following medications the morning of surgery: uloric,     Hold the following medications the day of surgery: pioglittazone, magnesium, sitatgliptitn    Hold NSAIDs (Motrin, Advil, Aleve, Ibuprofen, Naprosyn), vitamins, fish oil, and herbal supplements 7 days prior to the procedure.      DIET INSTRUCTIONS   - Regular diet until 8 hours prior to arrival. Clear liquids until 2 hours prior to arrival. No oral tobacco or mints for 8 hours prior to arrival.     HPI  Travis [name] is a 62 y.o. male that presents with need for pre-operative optimization.  ***    Operative Reference Anesthesia  ASA  {ASA Class:44993}    METS  {METS:44994}    History of difficult intubation {Yes/No:21034203}    Personal or family history of anesthesia problems {Yes/No:21034203}    OSA (STOP-BANG score) {Sleep Apnea  Compliance:21040503}      Operative Reference Surgery  Allergy to the following: latex, metal/nickel, iodine/shellfish, acrylic  {GQQ/PY:19509326}    Recent Hospitalization (within the last 3 months) {Yes/No:21034203}    Current Tobacco Use  {Yes/No-Amount:21040504}    Current Alcohol Use {Yes/No-Amount:21040504}    Current Drug Use  {Yes/No-Amount:21040504}      Assessment Tools   Hx of Lung Disease   {Yes/No:21034203} Ariscat Score: ***      Concern for Cardiac Ischemia {Yes/No:21034203} {Risk Stratification Algorithm:43942}     History of PE/DVT {Yes/No:21034203} Caprini Score: ***     History of Liver Disease {Yes/No:21034203} VOCAL-PENN Score: ***     Frailty Score {Yes/No:21034203} FRAIL Score: ***    []  F= Fatigue in the past month? (More unusual fatigue than normal or inability to complete ADLs)  []  R= Resistance- difficulty climbing a flight of stairs?   []  A= Ambulation- difficulty walking one block on level land?  []   I= Illness (HTN, DM, Cancer (other than minor skin CA), Chronic Lung Disease, Heart Attack, CHF, Angina, Asthma, Arthritis, Stroke, CKD); 5+=1 point; <5=0  []   L= Loss of weight- have you lost >5% of your body weight in the past year?    0= Robust health; Pre-Frail= 1-2; Frail= 3-4; Severely Frail= 5    AD8 (Age>65) {Yes/No:21034203} AD8 Score: {ZT2:45809}     Pain Team Referral {Yes/No:21034203} Rationale: {PAIN XIPJASNK:53976}       Past Medical History:  Past Medical History:   Diagnosis Date    Diabetes mellitus, type  2 (CMS HCC)     Gout, unspecified     History of kidney disease     HLD (hyperlipidemia)     HTN (hypertension)     they took me off of Lisinopril when I got out of the hospital-was taking due to family history    Kidney stones     Type 2 diabetes mellitus (CMS [place])     Wears glasses      Past Surgical History:   Procedure Laterality Date    FINGER SURGERY      HX WISDOM TEETH EXTRACTION      LITHOTRIPSY       Family Medical History:    None             Review of  Systems:  ROSPATIENTANESTHESIA    Exam:  There were no vitals taken for this visit.      There is no height or weight on file to calculate BMI.    Physical Exam    Diagnostics:     EKG: 02/14/2022  Sinus rhythm with marked sinus arrhythmia  Nonspecific T wave abnormality  Abnormal ECG  When compared with ECG of 08-Feb-2022 22:25,  No significant change was found  Confirmed by Lubertha Sayres ([id number]) on 02/14/2022 11:06:47 AM    Chest XRAY: ***    Labs:  03/06/22    Other Relevant Diagnostics: ***      Diagnostics included will be reviewed  and followed up as determined necessary prior to procedure       STOP-BANG:        No data to display                       CONSULTS: ***          Natasha Mead, FNP-C  03/03/2022, 20:40    Perioperative Evaluation [place]    It should be emphasized that the [facility] Provider consultation is to assess the patient's medical conditions and provide recommendations to optimize the patient to decrease peri-operative complications.  Decision on whether or not to proceed to surgery is made at the care team's discretion.

## 2022-03-06 ENCOUNTER — Ambulatory Visit: Payer: BC Managed Care – PPO

## 2022-03-06 ENCOUNTER — Other Ambulatory Visit: Payer: Self-pay

## 2022-03-06 ENCOUNTER — Inpatient Hospital Stay (HOSPITAL_COMMUNITY)
Admission: RE | Admit: 2022-03-06 | Discharge: 2022-03-06 | Disposition: A | Discharge status: Home / Self Care | Payer: BC Managed Care – PPO | Source: Ambulatory Visit

## 2022-03-06 VITALS — BP 145/80 | HR 95 | Temp 97.3°F | Resp 16 | Ht 72.64 in | Wt 235.2 lb

## 2022-03-06 DIAGNOSIS — Z01818 Encounter for other preprocedural examination: Secondary | ICD-10-CM

## 2022-03-06 DIAGNOSIS — N201 Calculus of ureter: Secondary | ICD-10-CM

## 2022-03-06 DIAGNOSIS — E119 Type 2 diabetes mellitus without complications: Secondary | ICD-10-CM

## 2022-03-06 DIAGNOSIS — I10 Essential (primary) hypertension: Secondary | ICD-10-CM

## 2022-03-06 LAB — POC BLOOD GLUCOSE (RESULTS): GLUCOSE, POC: 215 mg/dL — ABNORMAL HIGH (ref 70–105)

## 2022-03-06 NOTE — Patient Instructions (Signed)
MEDICATION INSTRUCTIONS    Take the following medications the morning of surgery: uloric.   Hold the following medications the day of surgery: pioglitazone, magnesium  Hold janumet the day before surgery and the day of surgery.   Hold NSAIDs (Motrin, Advil, Aleve, Ibuprofen, Naprosyn), vitamins, fish oil, and herbal supplements 7 days prior to the procedure.      DIET INSTRUCTIONS   - Regular diet until 8 hours prior to arrival. Clear liquids until 2 hours prior to arrival. No oral tobacco or mints for 8 hours prior to arrival.

## 2022-03-07 ENCOUNTER — Other Ambulatory Visit (INDEPENDENT_AMBULATORY_CARE_PROVIDER_SITE_OTHER): Payer: Self-pay | Admitting: UROLOGY

## 2022-03-07 ENCOUNTER — Encounter (INDEPENDENT_AMBULATORY_CARE_PROVIDER_SITE_OTHER): Payer: Self-pay

## 2022-03-20 ENCOUNTER — Inpatient Hospital Stay
Admission: RE | Admit: 2022-03-20 | Discharge: 2022-03-20 | Disposition: A | Discharge status: Home / Self Care | Payer: BC Managed Care – PPO | Source: Ambulatory Visit | Attending: UROLOGY | Admitting: UROLOGY

## 2022-03-20 ENCOUNTER — Ambulatory Visit (HOSPITAL_COMMUNITY): Payer: BC Managed Care – PPO | Admitting: Certified Registered"

## 2022-03-20 ENCOUNTER — Encounter (HOSPITAL_COMMUNITY)
Admission: RE | Disposition: A | Discharge status: Home / Self Care | Payer: Self-pay | Source: Ambulatory Visit | Attending: UROLOGY

## 2022-03-20 ENCOUNTER — Other Ambulatory Visit: Payer: Self-pay

## 2022-03-20 ENCOUNTER — Inpatient Hospital Stay (HOSPITAL_COMMUNITY): Payer: BC Managed Care – PPO

## 2022-03-20 ENCOUNTER — Encounter (HOSPITAL_COMMUNITY): Payer: Self-pay | Admitting: UROLOGY

## 2022-03-20 ENCOUNTER — Encounter (HOSPITAL_COMMUNITY): Payer: BC Managed Care – PPO | Admitting: UROLOGY

## 2022-03-20 DIAGNOSIS — I10 Essential (primary) hypertension: Secondary | ICD-10-CM

## 2022-03-20 DIAGNOSIS — Z960 Presence of urogenital implants: Secondary | ICD-10-CM | POA: Insufficient documentation

## 2022-03-20 DIAGNOSIS — Z87891 Personal history of nicotine dependence: Secondary | ICD-10-CM | POA: Insufficient documentation

## 2022-03-20 DIAGNOSIS — E119 Type 2 diabetes mellitus without complications: Secondary | ICD-10-CM

## 2022-03-20 DIAGNOSIS — Z7984 Long term (current) use of oral hypoglycemic drugs: Secondary | ICD-10-CM | POA: Insufficient documentation

## 2022-03-20 DIAGNOSIS — N201 Calculus of ureter: Secondary | ICD-10-CM

## 2022-03-20 DIAGNOSIS — N202 Calculus of kidney with calculus of ureter: Secondary | ICD-10-CM | POA: Insufficient documentation

## 2022-03-20 DIAGNOSIS — E785 Hyperlipidemia, unspecified: Secondary | ICD-10-CM | POA: Insufficient documentation

## 2022-03-20 DIAGNOSIS — N179 Acute kidney failure, unspecified: Secondary | ICD-10-CM

## 2022-03-20 DIAGNOSIS — Z6831 Body mass index (BMI) 31.0-31.9, adult: Secondary | ICD-10-CM | POA: Insufficient documentation

## 2022-03-20 DIAGNOSIS — E875 Hyperkalemia: Secondary | ICD-10-CM

## 2022-03-20 DIAGNOSIS — N139 Obstructive and reflux uropathy, unspecified: Secondary | ICD-10-CM

## 2022-03-20 DIAGNOSIS — E669 Obesity, unspecified: Secondary | ICD-10-CM | POA: Insufficient documentation

## 2022-03-20 LAB — POC BLOOD GLUCOSE (RESULTS)
GLUCOSE, POC: 183 mg/dl — ABNORMAL HIGH (ref 70–105)
GLUCOSE, POC: 145 mg/dl — ABNORMAL HIGH (ref 70–105)

## 2022-03-20 LAB — SURGICAL PATHOLOGY SPECIMEN

## 2022-03-20 SURGERY — URETEROSCOPY WITH HOLMIUM LASER
Anesthesia: General | Laterality: Bilateral | Wound class: Clean Contaminated Wounds-The respiratory, GI, Genital, or urinary

## 2022-03-20 MED ORDER — SODIUM CHLORIDE 0.9% FLUSH BAG - 250 ML
INTRAVENOUS | Status: DC | PRN
Start: 2022-03-20 — End: 2022-03-20

## 2022-03-20 MED ORDER — OXYCODONE 5 MG TABLET
5.0000 mg | ORAL_TABLET | Freq: Once | ORAL | Status: DC | PRN
Start: 2022-03-20 — End: 2022-03-20

## 2022-03-20 MED ORDER — DEXAMETHASONE SODIUM PHOSPHATE (PF) 10 MG/ML INJECTION SOLUTION
4.0000 mg | Freq: Once | INTRAMUSCULAR | Status: DC | PRN
Start: 2022-03-20 — End: 2022-03-20

## 2022-03-20 MED ORDER — FENTANYL (PF) 50 MCG/ML INJECTION SOLUTION
Freq: Once | INTRAMUSCULAR | Status: DC | PRN
Start: 2022-03-20 — End: 2022-03-20
  Administered 2022-03-20: 100 ug via INTRAVENOUS

## 2022-03-20 MED ORDER — SODIUM CHLORIDE 0.9 % (FLUSH) INJECTION SYRINGE
2.0000 mL | INJECTION | Freq: Three times a day (TID) | INTRAMUSCULAR | Status: DC
Start: 2022-03-20 — End: 2022-03-20

## 2022-03-20 MED ORDER — SODIUM CHLORIDE 0.9 % (FLUSH) INJECTION SYRINGE
2.0000 mL | INJECTION | INTRAMUSCULAR | Status: DC | PRN
Start: 2022-03-20 — End: 2022-03-20

## 2022-03-20 MED ORDER — DEXTROSE 5 % IN WATER (D5W) INTRAVENOUS SOLUTION
2.0000 g | Freq: Once | INTRAVENOUS | Status: AC
Start: 2022-03-20 — End: 2022-03-20
  Administered 2022-03-20: 2 g via INTRAVENOUS
  Filled 2022-03-20: qty 10

## 2022-03-20 MED ORDER — MIDAZOLAM (PF) 1 MG/ML INJECTION SOLUTION
Freq: Once | INTRAMUSCULAR | Status: DC | PRN
Start: 2022-03-20 — End: 2022-03-20
  Administered 2022-03-20: 2 mg via INTRAVENOUS

## 2022-03-20 MED ORDER — LACTATED RINGERS INTRAVENOUS SOLUTION
INTRAVENOUS | Status: DC
Start: 2022-03-20 — End: 2022-03-20

## 2022-03-20 MED ORDER — HYOSCYAMINE SULFATE 0.125 MG TABLET
0.1250 mg | ORAL_TABLET | Freq: Four times a day (QID) | ORAL | 0 refills | Status: AC | PRN
Start: 2022-03-20 — End: ?
  Filled 2022-03-20: qty 15, 4d supply, fill #0

## 2022-03-20 MED ORDER — PROPOFOL 10 MG/ML IV BOLUS
INJECTION | Freq: Once | INTRAVENOUS | Status: DC | PRN
Start: 2022-03-20 — End: 2022-03-20
  Administered 2022-03-20: 200 mg via INTRAVENOUS

## 2022-03-20 MED ORDER — ONDANSETRON HCL (PF) 4 MG/2 ML INJECTION SOLUTION
4.0000 mg | Freq: Once | INTRAMUSCULAR | Status: DC | PRN
Start: 2022-03-20 — End: 2022-03-20

## 2022-03-20 MED ORDER — FENTANYL (PF) 50 MCG/ML INJECTION SOLUTION
25.0000 ug | INTRAMUSCULAR | Status: DC | PRN
Start: 2022-03-20 — End: 2022-03-20

## 2022-03-20 MED ORDER — FENTANYL (PF) 50 MCG/ML INJECTION SOLUTION
12.5000 ug | INTRAMUSCULAR | Status: DC | PRN
Start: 2022-03-20 — End: 2022-03-20

## 2022-03-20 MED ORDER — DEXTROSE 5% IN WATER (D5W) FLUSH BAG - 250 ML
INTRAVENOUS | Status: DC | PRN
Start: 2022-03-20 — End: 2022-03-20

## 2022-03-20 MED ORDER — ACETAMINOPHEN 1,000 MG/100 ML (10 MG/ML) INTRAVENOUS SOLUTION
Freq: Once | INTRAVENOUS | Status: DC | PRN
Start: 2022-03-20 — End: 2022-03-20
  Administered 2022-03-20: 1000 mg via INTRAVENOUS

## 2022-03-20 MED ORDER — MIDAZOLAM 1 MG/ML INJECTION SOLUTION
INTRAMUSCULAR | Status: AC
Start: 2022-03-20 — End: 2022-03-20
  Filled 2022-03-20: qty 2

## 2022-03-20 MED ORDER — LIDOCAINE (PF) 100 MG/5 ML (2 %) INTRAVENOUS SYRINGE
INJECTION | Freq: Once | INTRAVENOUS | Status: DC | PRN
Start: 2022-03-20 — End: 2022-03-20
  Administered 2022-03-20: 100 mg via INTRAVENOUS

## 2022-03-20 MED ORDER — FENTANYL (PF) 50 MCG/ML INJECTION SOLUTION
INTRAMUSCULAR | Status: AC
Start: 2022-03-20 — End: 2022-03-20
  Filled 2022-03-20: qty 2

## 2022-03-20 MED ORDER — CIPROFLOXACIN 500 MG TABLET
500.0000 mg | ORAL_TABLET | Freq: Every day | ORAL | 0 refills | Status: AC
Start: 2022-03-20 — End: 2022-03-25
  Filled 2022-03-20: qty 5, 5d supply, fill #0

## 2022-03-20 MED ORDER — DEXAMETHASONE SODIUM PHOSPHATE 4 MG/ML INJECTION SOLUTION
Freq: Once | INTRAMUSCULAR | Status: DC | PRN
Start: 2022-03-20 — End: 2022-03-20
  Administered 2022-03-20: 4 mg via INTRAVENOUS

## 2022-03-20 MED ORDER — ONDANSETRON HCL (PF) 4 MG/2 ML INJECTION SOLUTION
Freq: Once | INTRAMUSCULAR | Status: DC | PRN
Start: 2022-03-20 — End: 2022-03-20
  Administered 2022-03-20: 4 mg via INTRAVENOUS

## 2022-03-20 MED ORDER — PROCHLORPERAZINE EDISYLATE 10 MG/2 ML (5 MG/ML) INJECTION SOLUTION
5.0000 mg | Freq: Once | INTRAMUSCULAR | Status: DC | PRN
Start: 2022-03-20 — End: 2022-03-20

## 2022-03-20 MED ORDER — SODIUM CHLORIDE 0.9 % INTRAVENOUS SOLUTION
INTRAVENOUS | Status: DC
Start: 2022-03-20 — End: 2022-03-20
  Administered 2022-03-20: 0 mL via INTRAVENOUS

## 2022-03-20 SURGICAL SUPPLY — 24 items
ADAPTER INSTR UROLOK BALL FIT (UROLOGICAL SUPPLIES) ×2 IMPLANT
ARMBRD POSITION 20X8X2IN DVN FOAM (IV TUBING & ACCESSORIES) ×1 IMPLANT
BAG DRAIN NONST LF  VNYL ACT 8MM DISP GE/OEC SYS (UROLOGICAL SUPPLIES) ×1 IMPLANT
BLANKET MISTRAL-AIR ADULT UPR BODY 79X29.9IN FRC AIR HI VOL BLWR INTUITIVE CONTROL PNL LRG LED (MED SURG SUPPLIES) ×1 IMPLANT
CATH URET 10FR 54CM .05IN 2 INJ LUM RADOPQ TAPER TIP STRL LF  DISP ACPT .038IN GW (UROLOGICAL SUPPLIES) ×1 IMPLANT
CONV USE 320027 - CHILDRENS USE 320025 - KIT RM TURNOVER CSTM NONST LF (DRAPE/PACKS/SHEETS/OR TOWEL) ×1
CONV USE 320027 - CHILDRENS USE 320025 - KIT RM TURNOVER CUSTOM NONST LF (DRAPE/PACKS/SHEETS/OR TOWEL) ×1 IMPLANT
CONV USE ITEM 338593 - PACK SURG CYSTO PREP STRL DISP LTX (CUSTOM TRAYS & PACK) ×1 IMPLANT
DEVICE STONE RTRVL DAKOTA OPENSURE 1.9FR 8MM 120CM 3 ARM FLXB BASKET NITINOL (ENDOSCOPIC SUPPLIES) ×1 IMPLANT
FIBER LASER MOSES 80HZ 60W 2J 200UM D/F/L LTHTRP (SURGICAL LASER SUPPLIES) ×1 IMPLANT
GARMENT COMPRESS MED CALF CENTAURA NYL VASOGRAD LTWT BRTHBL SEQ FIL BLU 18- IN (MED SURG SUPPLIES) ×1 IMPLANT
GOWN SURGICAL XL FULL REINFORCE LIGHTWT L3 XLONG H/L CLOSR STER LF BLUE (DRAPE/PACKS/SHEETS/OR TOWEL) ×1 IMPLANT
GW URO .035IN 150CM 3CM SENSOR STR FLX TP RADOPQ NITINOL SS HDRPH PTFE URET LF (UROLOGICAL SUPPLIES) ×3 IMPLANT
IRRG SAPS 1 ACT 1 WY VALVE VACU SYRG SYSTEM 10CC LF  URTSCP LASER LTHTRP (MED SURG SUPPLIES) ×1 IMPLANT
KIT RM TURNOVER CSTM NONST LF  DISP (DRAPE/PACKS/SHEETS/OR TOWEL) ×1
MAT FLR 40X24IN ABS WTPRF BACKSHEET (MED SURG SUPPLIES) ×1 IMPLANT
PACK SURG CYSTO PREP STRL DISP LTX (CUSTOM TRAYS & PACK) ×1
POSITION POSITION 9IN DONUT HEAD FOAM (MED SURG SUPPLIES) ×1 IMPLANT
SHEATH ACCESS 13-15FR RADOPQ 2 TAPER DIL TIP HUB NVGTR URET SS HDRPH 36CM LF  URTSCP (UROLOGICAL SUPPLIES) ×1 IMPLANT
SOL IRRG 0.9% NACL 3L PLASTIC CONTAINR UROMATIC LF (MEDICATIONS/SOLUTIONS) ×1 IMPLANT
STENT POLARIS ULTRA NAUT 6FR 26CM URET 2 DRMTR TAPER TIP LOW PROF GRAD PRCFLX HDR+ 2 PGTL CURVE LF (STENTS UROLOGICAL) ×2 IMPLANT
STRAP POSITION KNEE FOAM SFT ADJ CNTCT CLSR LF (MED SURG SUPPLIES) ×1 IMPLANT
TUBING SUCT CLR 20FT 9/32IN MEDIVAC NCDTV M/M CONN STRL LF (MED SURG SUPPLIES) ×1 IMPLANT
URTSCP FLXB LITHOVUE STD EMP (SURGICAL INSTRUMENTS) ×1 IMPLANT

## 2022-03-20 NOTE — Discharge Instructions (Signed)
SURGICAL DISCHARGE INSTRUCTIONS     Dr. Traci Sermon, Mali Edwin, MD  performed your URETEROSCOPY WITH HOLMIUM LASER, CYSTOSCOPY WITH URETEROSCOPY, CYSTOSCOPY WITH URETERAL STENT EXCHANGE, URETEROSCOPY WITH STONE EXTRACTION BASKET RETRIEVAL today at the [place]:  Monday through Friday from 6 a.m. - 7 p.m.: [phone]  Between 7 p.m. - 6 a.m., weekends and holidays:  Call [facility] at [phone] or [phone].    PLEASE SEE WRITTEN HANDOUTS AS DISCUSSED BY YOUR NURSE:      SIGNS AND SYMPTOMS OF A WOUND / INCISION INFECTION   Be sure to watch for the following:  Increase in redness or red streaks near or around the wound or incision.  Increase in pain that is intense or severe and cannot be relieved by the pain medication that your doctor has given you.  Increase in swelling that cannot be relieved by elevation of a body part, or by applying ice, if permitted.  Increase in drainage, or if yellow / green in color and smells bad. This could be on a dressing or a cast.  Increase in fever for longer than 24 hours, or an increase that is higher than 101 degrees Fahrenheit (normal body temperature is 98 degrees Fahrenheit). The incision may feel warm to the touch.    **CALL YOUR DOCTOR IF ONE OR MORE OF THESE SIGNS / SYMPTOMS SHOULD OCCUR.    ANESTHESIA INFORMATION   ANESTHESIA -- ADULT PATIENTS:  You have received intravenous sedation / general anesthesia, and you may feel drowsy and light-headed for several hours. You may even experience some forgetfulness of the procedure. DO NOT DRIVE A MOTOR VEHICLE or perform any activity requiring complete alertness or coordination until you feel fully awake in about 24-48 hours. Do not drink alcoholic beverages for at least 24 hours. Do not stay alone, you must have a responsible adult available to be with you. You may also experience a dry mouth or nausea for 24 hours. This is a normal side effect and will disappear as the effects  of the medication wear off.    REMEMBER   If you experience any difficulty breathing, chest pain, bleeding that you feel is excessive, persistent nausea or vomiting or for any other concerns:  Call your physician Dr. Traci Sermon at [phone] or [phone]. You may also ask to have the doctor on call paged. They are available to you 24 hours a day.    SPECIAL INSTRUCTIONS / COMMENTS       FOLLOW-UP APPOINTMENTS   Please call patient services at [phone] or [phone] to schedule a date / time of return. They are open Monday - Friday from 7:30 am - 5:00 pm.

## 2022-03-20 NOTE — Anesthesia Transfer of Care (Signed)
ANESTHESIA TRANSFER OF CARE   Travis [name] is a 62 y.o. ,male, Weight: 107 kg (235 lb 10.8 oz)   had Procedure(s):  URETEROSCOPY WITH HOLMIUM LASER  CYSTOSCOPY WITH URETEROSCOPY  CYSTOSCOPY WITH URETERAL STENT EXCHANGE  URETEROSCOPY WITH STONE EXTRACTION BASKET RETRIEVAL  performed  03/20/22   Primary Service: Mali Edwin Morley, *    Past Medical History:   Diagnosis Date    Diabetes mellitus, type 2 (CMS HCC)     Gout, unspecified     History of kidney disease     HLD (hyperlipidemia)     HTN (hypertension)     they took me off of Lisinopril when I got out of the hospital-was taking due to family history    Kidney stones     Type 2 diabetes mellitus (CMS [facility])     Wears glasses       Allergy History as of 03/20/22        No Known Allergies                  I completed my transfer of care / handoff to the receiving personnel during which we discussed:  Access, All key/critical aspects of case discussed, Airway, Analgesia, Antibiotics, Expectation of post procedure, Gave opportunity for questions and acknowledgement of understanding, Fluids/Product, PMHx and Labs      Post Location: PACU                        Additional Info:Patient transferred to PACU; report given to PACU RN. Patient in stable condition and VSS.                                       Last OR Temp: Temperature: 36.2 C (97.2 F)  ABG:  PCO2 (VENOUS)   Date Value Ref Range Status   02/10/2022 47 41 - 51 mm/Hg Final     PO2 (VENOUS)   Date Value Ref Range Status   02/10/2022 43 mm/Hg Final     Comment:     No ranges were established for venous pO2 as manufacturer does not recommend venous sample for this test.     POTASSIUM   Date Value Ref Range Status   02/15/2022 3.9 3.5 - 5.1 mmol/L Final     Comment:     Hemolysis can alter results at this level (mild).     CALCIUM   Date Value Ref Range Status   02/15/2022 8.6 8.6 - 10.3 mg/dL Final     Comment:     Gadolinium-containing contrast can interfere with calcium measurement.     Calculated P Axis    Date Value Ref Range Status   02/14/2022 51 degrees Final     Calculated R Axis   Date Value Ref Range Status   02/14/2022 45 degrees Final     Calculated T Axis   Date Value Ref Range Status   02/14/2022 -16 degrees Final     LACTATE   Date Value Ref Range Status   02/10/2022 2.4 (H) <=1.9 mmol/L Final     BASE EXCESS   Date Value Ref Range Status   02/10/2022 1.5 0.0 - 3.0 mmol/L Final     BASE DEFICIT   Date Value Ref Range Status   02/10/2022 2.2 0.0 - 3.0 mmol/L Final     BICARBONATE (VENOUS)   Date Value Ref Range Status  02/10/2022 22.6 22.0 - 29.0 mmol/L Final     %FIO2 (VENOUS)   Date Value Ref Range Status   02/10/2022 21.0 % Final     Airway:* No LDAs found *  Blood pressure (!) 157/88, pulse 73, temperature 36.2 C (97.2 F), resp. rate 16, height 1.855 m (6' 1.03"), weight 107 kg (235 lb 10.8 oz), SpO2 98%.

## 2022-03-20 NOTE — Progress Notes (Signed)
[facility]  Department of Surgery  Discharge Day Note    [name]  Date of service: 03/20/2022  Date of Admission:  03/20/2022  Hospital Day:  LOS: 0 days     Examination at discharge: stable  Vital Signs: Temperature: 36 C (96.8 F) (03/20/22 1030)  Heart Rate: 70 (03/20/22 1030)  BP (Non-Invasive): (!) 160/89 (03/20/22 1030)  Respiratory Rate: 17 (03/20/22 1030)  SpO2: 96 % (03/20/22 1030)  General: No distress    Assessment:   62 y.o. male s/p bilateral ureteroscopy with laser    Disposition/Plan :   The patient will be discharged home today with a 5-day course of antibiotics for UTI prophylaxis. He will pull the stent in 7 days (03/27/22). The patient will return in approximately 6 weeks for renal US.      Minus Liberty, MD  03/20/2022, 10:40    I saw and examined the patient prior to discharge and discussed all findings and follow up recommendations.  I approved and was present at the time of discharge.    Mali Edwin Brexton Sofia, MD  03/20/2022, 10:54

## 2022-03-20 NOTE — H&P (Signed)
[facility]  Department of Urologic Surgery   Pre-operative History and Physical    Travis [name]  [id number]  08/15/1960    CC:   Bilateral obstructing stones  right 1 cm mid ureteral stone, 6 mm distal ureteral stone, s/p bilateral ureteral stent placement  AKI  Hyperkalemia  Medical comorbidities including HTN, DM2    HPI: Travis [name] is a 61 y.o. male with a history of above who presents for bilateral ureteroscopy with laser. No new issues.    PMHx:   Past Medical History:   Diagnosis Date    Diabetes mellitus, type 2 (CMS HCC)     Gout, unspecified     History of kidney disease     HLD (hyperlipidemia)     HTN (hypertension)     they took me off of Lisinopril when I got out of the hospital-was taking due to family history    Kidney stones     Type 2 diabetes mellitus (CMS HCC)     Wears glasses           PSHx:   Past Surgical History:   Procedure Laterality Date    FINGER SURGERY      HX WISDOM TEETH EXTRACTION      LITHOTRIPSY           Family Hx: Marland Kitchen  Family Medical History:    None         Social Hx:   Social History     Socioeconomic History    Marital status: Married     Spouse name: Not on file    Number of children: Not on file    Years of education: Not on file    Highest education level: Not on file   Occupational History    Not on file   Tobacco Use    Smoking status: Never    Smokeless tobacco: Former     Types: Snuff     Quit date: 02/03/2022   Vaping Use    Vaping Use: Never used   Substance and Sexual Activity    Alcohol use: Never    Drug use: Never    Sexual activity: Not on file   Other Topics Concern    Ability to Walk 1 Flight of Steps without SOB/CP Yes    Routine Exercise No    Ability to Walk 2 Flight of Steps without SOB/CP Yes    Unable to Ambulate Not Asked    Total Care Not Asked    Ability To Do Own ADL's Yes    Uses Walker Not Asked    Other Activity Level Yes    Uses Cane Not Asked   Social History Narrative    Not on file     Social Determinants of Health      Financial Resource Strain: Not on file   Transportation Needs: Not on file   Social Connections: Low Risk  (02/08/2022)    Social Connections     SDOH Social Isolation: 5 or more times a week   Intimate Partner Violence: Not on file   Housing Stability: Not on file     Medications:   No current outpatient medications on file.     Allergies: No Known Allergies    ROS:  All 10 systems were reviewed.  There were pertinent positives in: Genital urinary system  There are no new changes to the prior reviewed review of systems.    Physical Exam:  BP (!) 157/88  Pulse 73   Temp 36.2 C (97.2 F)   Resp 16   Ht 1.855 m (6' 1.03")   Wt 107 kg (235 lb 10.8 oz)   SpO2 98%   BMI 31.07 kg/m       General - Alert/oriented; NAD  Neck - Supple, trachea midline  Lungs - Respirations are unlabored, good inspiratory effort  Abdomen - Non-distended   Musculoskeletal - 4 extremities intact  Neurological - CN II-XII Grossly intact  GU system - Deferred. Plan to examine in OR    Assessment:  62 y.o. male with:     1. Bilateral obstructing stones  right 1 cm mid ureteral stone, 6 mm distal ureteral stone, s/p bilateral ureteral stent placement  2. AKI  3. Hyperkalemia  4. Medical comorbidities including HTN, DM2    Plan:  To OR for bilateral ureteroscopy with laser  Consent obtained  The patient understands the risks of the procedure, which include bleeding, infection, recurrence, damage to the surrounding structures, failure to correct pathology, loss of sensation, and the risks of anesthesia (heart attack, stroke, death, etc.)    All questions of patient and family answered    Minus Liberty, MD      I saw and examined the patient.  I reviewed the resident's note.  I agree with the findings and plan of care as documented in the resident's note.  Any exceptions/additions are edited/noted.    Mali Edwin Belva Koziel, MD

## 2022-03-20 NOTE — OR Surgeon (Signed)
[place] OF UROLOGY   OPERATION SUMMARY     PATIENT NAME: Travis [name] NUMBER: [id number]   DATE OF SERVICE: 03/20/2022   DATE OF BIRTH: 1960-04-17      PREOPERATIVE DIAGNOSIS:   Bilateral obstructing stones  right 1 cm mid ureteral stone, 6 mm distal ureteral stone, s/p bilateral ureteral stent placement  AKI  Hyperkalemia  Medical comorbidities including HTN, DM2    POSTOPERATIVE DIAGNOSIS: Same    NAME OF PROCEDURES:   1. Cystourethroscopy with bilateral ureteral stent removal.   2. Bilateral ureterorenoscopy with laser lithotripsy.  3. Bilateral ureterorenoscopy with stone basket retrieval.   4. Intraoperative fluroscopy    SURGEONS: Mali Jovana Rembold, MD (staff), Minus Liberty, MD (resident)     ANESTHESIA: General  ESTIMATED BLOOD LOSS: None  FLUIDS: Per anesthesia  COMPLICATIONS: None  SPECIMENS:   Left ureteral stone   Left renal stone  Right renal stone    IMPLANTS: A bilateral-sided 6-French x 26-cm Polaris Ultra double-J ureteral stent with an extracorporeal string    INDICATIONS FOR PROCEDURE: Travis [name] is a 62 y.o. male with a history of bilateral calculi. Patient presents today to undergo operative intervention.    OPERATIVE FINDINGS:   1. Previously placed indwelling ureteral stents were removed intact.   2. Left ureteral stone was lasered and the fragments were basketed and retrieved.   3. Left renal stones were lasered, and removed.  4. Right renal stone was lasered into fragments, and these were removed with basket.  5. No additional significantly sized stone fragments were noted to be present in the patient's kidney or ureter after thoroughly visualizing his entire bilateral urinary tract.   6. Appropriate placement of bilateral ureteral stent as determined by intraoperative fluoroscopy and direct visualization.    DESCRIPTION OF PROCEDURE: The patient was consented preoperatively. All risks and benefits of the procedure were explained. Patient was  then brought back to the operative suite and placed on the operating table. Anesthesia was induced. The patient was placed in dorsal lithotomy position. Genitalia were prepped and draped in usual sterile fashion. Using a 22-French sheath and 30-degrees lens, we inserted the cystoscope into the urethra and into the bladder under direct visualization. Once inside the bladder, panendoscopic views of the bladder were obtained including anterior lateral walls, floor, trigone and dome. No masses, lesions or stones were noted to be present in the patient's bladder. Attention was then drawn towards the left ureteral orifice where the previously placed stent was noted to be intact. A 0.035 straight tip Sensor wire was inserted along he stent into the renal pelvis as demonstrated by intraoperative fluoroscopy. We then inserted a wire along the right stent into the renal pelvis. Using graspers, the distal coils were grasped and brought out extracorporeally. We then assembled a semi-rigid ureteroscope and advanced into the left ureter along the wire. The stone was quickly visualized. Using a laser fiber, we broke the stone up into fragments and removed them. A second sensor wire was then advanced through the scope into the renal pelvis.Securing one of the wires as a safety wire and utilizing the working wire, a 13/15 Fr x 36 cm ureteral access sheath was inserted up to the UPJ. A flexible ureteroscope was then assembled and advanced through the sheath into the patient's ureter. The stone was quickly visualized. Using a 200 micron laser fiber, the stone was fragmented into several small pieces. Using a [employer], the stone fragments  were grasped, removed extracorporeally, and sent for stone analysis. The ureteroscope was then re-advanced up the ureter and into the patient's renal pelvis and a complete renoscopy was performed. No additional significantly sized stone fragments were noted to be present. The ureteroscope was  slowly withdrawn visualizing the ureter in its entirety and no additional stone fragments were noted to be present.     We then shifted to the right side, and using a dual lumen catheter, advanced a sensor wire into the renal pelvis. The ureteral access sheath was inserted up to the level of the stone. We then re-advanced the flexible ureteroscope into the renal pelvis, and stone was quickly visualized. The stone was lasered into fragments, which were removed with the [place] basket. The scope was then slowly withdrawn, visualizing the ureter. At this time, using previously placed safety wires, 6-French x 26cm Polaris Ultra double-J ureteral stents were advanced over the safety wire into the renal pelvises as demonstrated by intraoperative fluoroscopy. The cystoscope was then advanced into the patient's bladder and visualized the appropriate distal coil, confirming placement of the patient's ureteral stents. An extracorporal string was left. At this time, the cystoscope was broken down, the bladder was emptied and the procedure was terminated. The patient tolerated the procedure well with no complications. Dr. Mali Jerman Tinnon was present and participated in the entire procedure.     DISPOSITION: The patient will be discharged home today with a 5-day course of antibiotics for UTI prophylaxis. He will pull the stent in 7 days (03/27/22). The patient will return in approximately 6 weeks for renal US.    Minus Liberty, MD   [facility]  Division of Urology   03/20/2022, 10:31     I was present for the entire viewing portion of the endoscopy. I reviewed all imaging, including the fluoroscopy. My impression of the retrograde pyelogram was appropriate stent placement bilaterally at the end of the case.      Mali Edwin Hetal Proano, MD 03/20/2022, 10:53    Mali Adrin Julian, M.D.  Associate Professor  [facility] Department of Urology

## 2022-03-20 NOTE — Anesthesia Postprocedure Evaluation (Signed)
Anesthesia Post Op Evaluation    Patient: Travis [name]  Procedure(s):  URETEROSCOPY WITH HOLMIUM LASER  CYSTOSCOPY WITH URETEROSCOPY  CYSTOSCOPY WITH URETERAL STENT EXCHANGE  URETEROSCOPY WITH STONE EXTRACTION BASKET RETRIEVAL    Last Vitals:Temperature: 36 C (96.8 F) (03/20/22 1030)  Heart Rate: 70 (03/20/22 1030)  BP (Non-Invasive): (!) 160/89 (03/20/22 1030)  Respiratory Rate: 17 (03/20/22 1030)  SpO2: 96 % (03/20/22 1030)    No notable events documented.    Patient is sufficiently recovered from the effects of anesthesia to participate in the evaluation and has returned to their pre-procedure level.  Patient location during evaluation: PACU       Patient participation: complete - patient participated  Level of consciousness: awake and alert and responsive to verbal stimuli  Multimodal Pain Management: Multimodal analgesia used between 6 hours prior to anesthesia start to PACU discharge  Pain score: 0  Pain management: adequate  Airway patency: patent    Anesthetic complications: no  Cardiovascular status: acceptable  Respiratory status: acceptable  Hydration status: acceptable  Patient post-procedure temperature: Pt Normothermic   PONV Status: Absent

## 2022-03-20 NOTE — Anesthesia Preprocedure Evaluation (Signed)
ANESTHESIA PRE-OP EVALUATION  Planned Procedure: URETEROSCOPY WITH HOLMIUM LASER (Bilateral)  Review of Systems     anesthesia history negative               Pulmonary  negative pulmonary ROS,    Cardiovascular    Hypertension, well controlled and hyperlipidemia ,No peripheral edema,  Exercise Tolerance: > or = 4 METS        GI/Hepatic/Renal    kidney stones        Endo/Other    obesity,   type 2 diabetes/ stable    Neuro/Psych/MS   negative neuro/psych ROS,      Cancer                        Physical Assessment      Airway       Mallampati: I    TM distance: >3 FB    Neck ROM: full  Mouth Opening: good.            Dental       Dentition intact             Pulmonary    Breath sounds clear to auscultation  (-) no rhonchi, no decreased breath sounds, no wheezes, no rales and no stridor     Cardiovascular    Rhythm: regular  Rate: Normal  (-) no friction rub, carotid bruit is not present, no peripheral edema and no murmur     Other findings              Plan  ASA 2     Planned anesthesia type: general     general anesthesia with laryngeal mask airway      PONV Plan:  I plan to administer pharmcologic prophalaxis antiemetics              Intravenous induction     Anesthesia issues/risks discussed are: PONV, Cardiac Events/MI and Stroke.  Anesthetic plan and risks discussed with patient  signed consent obtained          Patient's NPO status is appropriate for Anesthesia.           Plan discussed with CRNA.

## 2022-03-21 ENCOUNTER — Other Ambulatory Visit: Payer: Self-pay

## 2022-03-21 ENCOUNTER — Encounter (HOSPITAL_COMMUNITY): Payer: Self-pay

## 2022-03-21 ENCOUNTER — Emergency Department
Admission: EM | Admit: 2022-03-21 | Discharge: 2022-03-21 | Disposition: A | Discharge status: Home / Self Care | Payer: BC Managed Care – PPO | Attending: Emergency Medicine | Admitting: Emergency Medicine

## 2022-03-21 DIAGNOSIS — K5900 Constipation, unspecified: Secondary | ICD-10-CM | POA: Insufficient documentation

## 2022-03-21 NOTE — Discharge Instructions (Addendum)
Return to the emergency department for any acute changes or any new symptoms of concern.

## 2022-03-21 NOTE — ED Nurses Note (Signed)
Large Bm per patient, states he feels  much better

## 2022-03-21 NOTE — ED Provider Notes (Signed)
[facility]  ED Primary Provider Note  History of Present Illness   Chief Complaint   Patient presents with    Constipation     Travis [name] is a 62 y.o. male who had concerns including Constipation.  Arrival: The patient arrived by Car    62 year old male presents to the emergency department with complaint of constipation.  Patient states that the last time he had bowel movement was yesterday morning and even then, it was not much.  He said that he took MiraLax today for the constipation but was not successful.  He also said that he tried to do a digital evacuation but was not successful.  He denies any nausea vomiting, does not have any significant abdominal pain, no fever or chills, has some rectal pain.  He said that he was at [facility] yesterday for renal calculi removal and stent placement.  He said he has not on any narcotics that will could him constipation.      History Reviewed This Encounter: Medical History  Surgical History  Family History  Social History    Physical Exam   ED Triage Vitals [03/21/22 1723]   BP (Non-Invasive) (!) 163/93   Heart Rate 100   Respiratory Rate 18   Temperature 36.5 C (97.7 F)   SpO2 96 %   Weight 104 kg (230 lb)   Height 1.88 m (6\' 2" )     Physical Exam  Vitals and nursing note reviewed.   Constitutional:       General: He is not in acute distress.     Appearance: Normal appearance. He is not ill-appearing.   HENT:      Head: Normocephalic and atraumatic.      Nose: Nose normal.      Mouth/Throat:      Mouth: Mucous membranes are moist.      Pharynx: Oropharynx is clear.   Eyes:      Conjunctiva/sclera: Conjunctivae normal.   Cardiovascular:      Rate and Rhythm: Regular rhythm.      Pulses: Normal pulses.      Heart sounds: Normal heart sounds.   Pulmonary:      Effort: Pulmonary effort is normal.      Breath sounds: Normal breath sounds.   Abdominal:      Palpations: Abdomen is soft.      Tenderness: There is no abdominal tenderness.    Genitourinary:     Comments: Stool felt high up in the rectum, was therefore unable to scoop it digitally.  Musculoskeletal:      Cervical back: Normal range of motion.      Right lower leg: No edema.      Left lower leg: No edema.   Skin:     General: Skin is warm and dry.   Neurological:      General: No focal deficit present.      Mental Status: He is alert and oriented to person, place, and time.   Psychiatric:         Mood and Affect: Mood normal.         Behavior: Behavior normal.       Patient Data   Labs Ordered/Reviewed - No data to display  No orders to display     Medical Decision Making        Medical Decision Making  Patient presenting with constipation causing him abdominal discomfort.  Attempt at digital disimpaction was not successful.  Patient was then given  Soapsuds enema 750 mL and he had a good and significant bowel movement, stating that he feels very relieved the abdominal pain has resolved.  I discussed prevention of constipation with him.  He understands to return to the emergency department for any acute changes or any new symptoms of concern.                Clinical Impression   Constipation, unspecified constipation type (Primary)       Disposition: Discharged

## 2022-03-21 NOTE — ED Triage Notes (Signed)
Patient presents to ED with constipation, patient had surgery for a yesterday stone removal and stent placement. Last Bm 03-19-22. Patient took a dose of Miralax today and tried to disimpact self, without success. Patient having lower abdominal and rectal pain. Patient took Advil early this morning for pain.

## 2022-03-21 NOTE — ED Nurses Note (Signed)
Soap suds enema give, patient now sitting on the commode

## 2022-03-21 NOTE — ED Nurses Note (Signed)
Patient discharged home with family.  AVS reviewed with patient/care giver.  A written copy of the AVS and discharge instructions was given to the patient/care giver.  Questions sufficiently answered as needed.  Patient/care giver encouraged to follow up with PCP as indicated.  In the event of an emergency, patient/care giver instructed to call 911 or go to the nearest emergency room.

## 2022-03-30 LAB — STONE ANALYSIS W/ IMAGE
STONE WEIGHT: 0.084 g
STONE WEIGHT: 0.108 g
STONE WEIGHT: 0.278 g

## 2022-04-03 ENCOUNTER — Inpatient Hospital Stay
Admission: RE | Admit: 2022-04-03 | Discharge: 2022-04-03 | Disposition: A | Discharge status: Home / Self Care | Payer: BC Managed Care – PPO | Source: Ambulatory Visit | Attending: UROLOGY | Admitting: UROLOGY

## 2022-04-03 ENCOUNTER — Other Ambulatory Visit: Payer: Self-pay

## 2022-04-03 DIAGNOSIS — N139 Obstructive and reflux uropathy, unspecified: Secondary | ICD-10-CM | POA: Insufficient documentation

## 2022-04-04 ENCOUNTER — Encounter (HOSPITAL_COMMUNITY): Payer: Self-pay

## 2022-05-17 ENCOUNTER — Ambulatory Visit: Payer: BC Managed Care – PPO | Attending: UROLOGY | Admitting: UROLOGY

## 2022-05-17 ENCOUNTER — Encounter (INDEPENDENT_AMBULATORY_CARE_PROVIDER_SITE_OTHER): Payer: Self-pay | Admitting: UROLOGY

## 2022-05-17 ENCOUNTER — Other Ambulatory Visit: Payer: Self-pay

## 2022-05-17 VITALS — BP 153/91 | HR 93 | Temp 97.7°F | Ht 74.0 in | Wt 249.1 lb

## 2022-05-17 DIAGNOSIS — N200 Calculus of kidney: Secondary | ICD-10-CM | POA: Insufficient documentation

## 2022-05-17 LAB — POC URINALYSIS (RESULTS)
BILIRUBIN: NEGATIVE mg/dL
BLOOD: NEGATIVE mg/dL
GLUCOSE: NEGATIVE mg/dL
KETONES: NEGATIVE mg/dL
LEUKOCYTES: NEGATIVE WBCs/uL
NITRITE: NEGATIVE
PH: 5.5 (ref 5.0–8.0)
PROTEIN: NEGATIVE mg/dL
SPECIFIC GRAVITY: 1.015 (ref 1.005–1.030)
UROBILINOGEN: 0.2 mg/dL

## 2022-05-17 NOTE — Progress Notes (Signed)
[place] PROGRESS NOTE    Name: Omri Caponi  MRN: [id number]  Date of Birth: 1960/05/25  Date of Consultation: 05/17/2022    SUBJECTIVE: Travis [name] is a 62 y.o. male with a h/o nephrolithiasis who underwent bilateral ureteroscopy with laser lithotripsy for bilateral ureteral stones in January 2023. He removed his stents without issue. He typically passes 1-2 stones per month during the summer. He says he was evaluated by nephrology when living in [place] approximately ten years ago. He takes a vitamin C supplement and D supplement. He drinks 1 gallon of water each day. He eats mostly vegetables with a small amount of red meat.     PMHx:   Past Medical History:   Diagnosis Date    Diabetes mellitus, type 2 (CMS HCC)     Gout, unspecified     History of kidney disease     HLD (hyperlipidemia)     HTN (hypertension)     they took me off of Lisinopril when I got out of the hospital-was taking due to family history    Kidney stones     Type 2 diabetes mellitus (CMS HCC)     Wears glasses            PSHx:   Past Surgical History:   Procedure Laterality Date    FINGER SURGERY      HX WISDOM TEETH EXTRACTION      LITHOTRIPSY             Family Hx: Marland Kitchen  Family Medical History:    None           Social Hx:   Social History     Socioeconomic History    Marital status: Married     Spouse name: Not on file    Number of children: Not on file    Years of education: Not on file    Highest education level: Not on file   Occupational History    Not on file   Tobacco Use    Smoking status: Never    Smokeless tobacco: Former     Types: Snuff     Quit date: 02/03/2022   Vaping Use    Vaping status: Never Used   Substance and Sexual Activity    Alcohol use: Never    Drug use: Never    Sexual activity: Not on file   Other Topics Concern    Ability to Walk 1 Flight of Steps without SOB/CP Yes    Routine Exercise No    Ability to Walk 2 Flight of Steps without SOB/CP Yes     Unable to Ambulate Not Asked    Total Care Not Asked    Ability To Do Own ADL's Yes    Uses Walker Not Asked    Other Activity Level Yes    Uses Cane Not Asked   Social History Narrative    Not on file     Social Determinants of Health     Financial Resource Strain: Not on file   Transportation Needs: Not on file   Social Connections: Low Risk  (02/08/2022)    Social Connections     SDOH Social Isolation: 5 or more times a week   Intimate Partner Violence: Not on file   Housing Stability: Not on file       Medications:   Current Outpatient Medications   Medication Sig    cholecalciferol, vitamin D3,  50 mcg (2,000 unit) Oral Tablet Take 1 Tablet (2,000 Units total) by mouth Once a day    Febuxostat (ULORIC) 80 mg Oral Tablet Take 1 Tablet (80 mg total) by mouth Once a day    hyoscyamine sulfate (LEVSIN) 0.125 mg Oral Tablet Take 1 Tablet (0.125 mg total) by mouth Every 6 hours as needed for Other (bladder spasms)    magnesium oxide (MAG-OX) 400 mg Oral Tablet Take 1 Tablet (400 mg total) by mouth Twice daily    pioglitazone (ACTOS) 45 mg Oral Tablet Take 1 Tablet (45 mg total) by mouth Once a day (Patient not taking: Reported on 05/17/2022)    rosuvastatin (CRESTOR) 10 mg Oral Tablet Take 1 Tablet (10 mg total) by mouth Every evening    SITagliptin phos-metformin (JANUMET XR) 50-1,000 mg Oral Tab, Multiphasic Release 24 hr Take 1 Tablet by mouth Twice daily       Allergies: No Known Allergies    ROS:  Other than ROS in the HPI, all other systems were negative.      PHYSICAL EXAM:  BP (!) 153/91 (Site: Right, Patient Position: Sitting, Cuff Size: Adult)   Pulse 93   Temp 36.5 C (97.7 F) (Temporal)   Ht 1.88 m (6\' 2" )   Wt 113 kg (249 lb 1.9 oz)   SpO2 97%   BMI 31.99 kg/m       VS: Appears vitally stable  General - 62 y.o. male, NAD  Lungs - Unlabored respiratory effort  Abdomen - Soft, NT/ND  Musculoskeletal - FROM in all 4 extremities  Neurological - CN II-XII Grossly intact  GU system - No CVAT  bilaterally. No suprapubic fullness or tenderness.  Skin - Warm and dry   Psych - Normal affect    Imaging:  I reviewed imaging, including his RUS from (04/03/22) which did not reveal any nephrolithiasis    Stone Analysis  Calcium oxalate monohydrate and dihydrate (03/20/22)    Laboratory Analysis      ASSESSMENT:  62 y.o. male with:    1. History of nephrolithiasis s/p b/l URS (03/20/22)    PLAN:    -Discussed discontinuation of vitamin C supplementation. Reviewed risk of hypercalcemia and vitamin D supplementation with patient. Currently calcium is at normal levels  -Reviewed dietary changes regarding oxalate. Patient given additional paper information  -Behavioral changes including low salt intake and increased fluid intake were discussed  -Offered nephrologic evaluation with 24 hour urine. Patient politely declined  -RTC in one year with RUS and KUB    Alyce Pagan, MD 05/17/2022, 13:22  [facility]  Department of Urology Resident       I saw and examined the patient.  I reviewed the resident's note.  I agree with the findings and plan of care as documented in the resident's note.  Any exceptions/additions are edited/noted.    Mali Edwin Turner Baillie, MD

## 2023-04-09 ENCOUNTER — Ambulatory Visit (HOSPITAL_COMMUNITY): Payer: BC Managed Care – PPO | Admitting: Certified Registered"

## 2023-04-09 ENCOUNTER — Ambulatory Visit
Admission: RE | Admit: 2023-04-09 | Discharge: 2023-04-09 | Disposition: A | Discharge status: Home / Self Care | Payer: BC Managed Care – PPO | Source: Ambulatory Visit | Attending: Emergency Medicine | Admitting: Emergency Medicine

## 2023-04-09 ENCOUNTER — Encounter (HOSPITAL_COMMUNITY)
Admission: RE | Disposition: A | Discharge status: Home / Self Care | Payer: Self-pay | Source: Ambulatory Visit | Attending: Emergency Medicine

## 2023-04-09 ENCOUNTER — Other Ambulatory Visit: Payer: Self-pay

## 2023-04-09 ENCOUNTER — Encounter (HOSPITAL_COMMUNITY): Payer: Self-pay | Admitting: Emergency Medicine

## 2023-04-09 DIAGNOSIS — K644 Residual hemorrhoidal skin tags: Secondary | ICD-10-CM | POA: Insufficient documentation

## 2023-04-09 DIAGNOSIS — Z87442 Personal history of urinary calculi: Secondary | ICD-10-CM | POA: Insufficient documentation

## 2023-04-09 DIAGNOSIS — Z1211 Encounter for screening for malignant neoplasm of colon: Secondary | ICD-10-CM | POA: Insufficient documentation

## 2023-04-09 DIAGNOSIS — Z800 Family history of malignant neoplasm of digestive organs: Secondary | ICD-10-CM | POA: Insufficient documentation

## 2023-04-09 DIAGNOSIS — E119 Type 2 diabetes mellitus without complications: Secondary | ICD-10-CM | POA: Insufficient documentation

## 2023-04-09 DIAGNOSIS — E785 Hyperlipidemia, unspecified: Secondary | ICD-10-CM | POA: Insufficient documentation

## 2023-04-09 DIAGNOSIS — Z860100 Personal history of colon polyps, unspecified: Secondary | ICD-10-CM | POA: Insufficient documentation

## 2023-04-09 DIAGNOSIS — K5730 Diverticulosis of large intestine without perforation or abscess without bleeding: Secondary | ICD-10-CM | POA: Insufficient documentation

## 2023-04-09 DIAGNOSIS — I10 Essential (primary) hypertension: Secondary | ICD-10-CM | POA: Insufficient documentation

## 2023-04-09 DIAGNOSIS — Z7984 Long term (current) use of oral hypoglycemic drugs: Secondary | ICD-10-CM | POA: Insufficient documentation

## 2023-04-09 LAB — POC BLOOD GLUCOSE (RESULTS)
GLUCOSE, POC: 114 mg/dL — ABNORMAL HIGH (ref 74–106)
GLUCOSE, POC: 107 mg/dL — ABNORMAL HIGH (ref 74–106)

## 2023-04-09 SURGERY — COLONOSCOPY
Anesthesia: Monitor Anesthesia Care | Wound class: N/A - Imaging Only

## 2023-04-09 MED ORDER — SIMETHICONE 40 MG/0.6 ML ORAL DROPS,SUSPENSION
Freq: Once | ORAL | Status: DC | PRN
Start: 2023-04-09 — End: 2023-04-09
  Administered 2023-04-09: 40 mg via ORAL

## 2023-04-09 MED ORDER — PROPOFOL 10 MG/ML INTRAVENOUS EMULSION
INTRAVENOUS | Status: AC
Start: 2023-04-09 — End: 2023-04-09
  Filled 2023-04-09: qty 20

## 2023-04-09 MED ORDER — LACTATED RINGERS INTRAVENOUS SOLUTION
INTRAVENOUS | Status: DC
Start: 2023-04-09 — End: 2023-04-09
  Administered 2023-04-09: 0 mL via INTRAVENOUS

## 2023-04-09 MED ORDER — PROPOFOL 10 MG/ML IV BOLUS
INJECTION | Freq: Once | INTRAVENOUS | Status: DC | PRN
Start: 2023-04-09 — End: 2023-04-09
  Administered 2023-04-09: 100 mg via INTRAVENOUS
  Administered 2023-04-09: 150 mg via INTRAVENOUS
  Administered 2023-04-09: 100 mg via INTRAVENOUS

## 2023-04-09 MED ORDER — LIDOCAINE (PF) 20 MG/ML (2 %) INJECTION SOLUTION
INTRAMUSCULAR | Status: AC
Start: 2023-04-09 — End: 2023-04-09
  Filled 2023-04-09: qty 5

## 2023-04-09 MED ORDER — LIDOCAINE HCL 20 MG/ML (2 %) INJECTION SOLUTION
Freq: Once | INTRAMUSCULAR | Status: DC | PRN
Start: 2023-04-09 — End: 2023-04-09
  Administered 2023-04-09: 60 mg

## 2023-04-09 SURGICAL SUPPLY — 7 items
ELECTRODE PATIENT RTN 9FT VLAB C30- LB RM PHSV ACRL FOAM CORD NONIRRITATE NONSENSITIZE ADH STRP (SURGICAL CUTTING SUPPLIES) IMPLANT
FORCEPS BIOPSY ALGTR JAW FENESTRATE CUP SWNG SMOOTH INSRT 230CM 2.8MM ENDOJAW SS OVAL STRL DISP (ENDOSCOPIC SUPPLIES) IMPLANT
FORCEPS GRSP 3 PRONG RING DIST TIP 230CMX20MM POLYGRAB TRPD STRL DISP (ENDOSCOPIC SUPPLIES) IMPLANT
KIT SURG ENDO NONST DISP LF ~~LOC~~ ~~LOC~~ (CUSTOM TRAYS & PACK) ×1 IMPLANT
MANIFLD SUCT NPTN 2 STD 1 PORT WASTE MGMT SYS NONST LF  DISP (MED SURG SUPPLIES) ×1 IMPLANT
SNARE THK.47MM STD OVAL 2300MM 2.8MM 25MM SNAREMASTER BRD WRE INT HNDL SHORT LOOP ENDOS CF GIF (ENDOSCOPIC SUPPLIES) IMPLANT
TUBING SUCT 20FT .25IN UNIV FEMALE CONN RB STRL LF (MED SURG SUPPLIES) ×1 IMPLANT

## 2023-04-09 NOTE — Anesthesia Transfer of Care (Signed)
 ANESTHESIA TRANSFER OF CARE   Travis [name] is a 63 y.o. ,male, Weight: 104 kg (229 lb 8 oz)   had Procedure(s):  COLONOSCOPY  performed  04/09/23   Primary Service: Silvestre Mesi, MD    Past Medical History:   Diagnosis Date    Diabetes mellitus, type 2 (CMS HCC)     Gout, unspecified     History of kidney disease     HLD (hyperlipidemia)     HTN (hypertension)     they took me off of Lisinopril when I got out of the hospital-was taking due to family history    Kidney stones     Type 2 diabetes mellitus (CMS [facility])     Wears glasses       Allergy History as of 04/09/23        No Known Allergies                  I completed my transfer of care / handoff to the receiving personnel during which we discussed:  All key/critical aspects of case discussed  Report given to: Geanie Cooley, RN    Post Location: PACU                                                             Last OR Temp: Temperature: 36.4 C (97.5 F)  ABG:  PCO2 (VENOUS)   Date Value Ref Range Status   02/10/2022 47 41 - 51 mm/Hg Final     PO2 (VENOUS)   Date Value Ref Range Status   02/10/2022 43 mm/Hg Final     Comment:     No ranges were established for venous pO2 as manufacturer does not recommend venous sample for this test.     POTASSIUM   Date Value Ref Range Status   02/15/2022 3.9 3.5 - 5.1 mmol/L Final     Comment:     Hemolysis can alter results at this level (mild).     KETONES   Date Value Ref Range Status   05/17/2022 Negative Negative mg/dL Final     CALCIUM   Date Value Ref Range Status   02/15/2022 8.6 8.6 - 10.3 mg/dL Final     Comment:     Gadolinium-containing contrast can interfere with calcium measurement.     Calculated P Axis   Date Value Ref Range Status   02/14/2022 51 degrees Final     Calculated R Axis   Date Value Ref Range Status   02/14/2022 45 degrees Final     Calculated T Axis   Date Value Ref Range Status   02/14/2022 -16 degrees Final     LACTATE   Date Value Ref Range Status   02/10/2022 2.4 (H) <=1.9 mmol/L Final      BASE EXCESS   Date Value Ref Range Status   02/10/2022 1.5 0.0 - 3.0 mmol/L Final     BASE DEFICIT   Date Value Ref Range Status   02/10/2022 2.2 0.0 - 3.0 mmol/L Final     BICARBONATE (VENOUS)   Date Value Ref Range Status   02/10/2022 22.6 22.0 - 29.0 mmol/L Final     %FIO2 (VENOUS)   Date Value Ref Range Status   02/10/2022 21.0 % Final     Airway:* No  LDAs found *  Blood pressure 133/78, pulse 88, temperature 36.4 C (97.5 F), resp. rate 16, height 1.88 m (6\' 2" ), weight 104 kg (229 lb 8 oz), SpO2 94%.

## 2023-04-09 NOTE — Anesthesia Preprocedure Evaluation (Signed)
 ANESTHESIA PRE-OP EVALUATION  Planned Procedure: COLONOSCOPY  Review of Systems                   Pulmonary     Cardiovascular    Hypertension and hyperlipidemia ,No peripheral edema,        GI/Hepatic/Renal    kidney stones        Endo/Other      type 2 diabetes/ controlled/ controlled with oral medications    Neuro/Psych/MS        Cancer                        Physical Assessment      Airway       Mallampati: II    TM distance: <3 FB    Neck ROM: full  Mouth Opening: good.  Facial hair  No Beard        Dental       Dentition intact             Pulmonary    Breath sounds clear to auscultation  (-) no rhonchi, no decreased breath sounds, no wheezes, no rales and no stridor     Cardiovascular    Rhythm: regular  Rate: Normal  (-) no friction rub, carotid bruit is not present, no peripheral edema and no murmur     Other findings              Plan  ASA 2     Planned anesthesia type: MAC                         Intravenous induction       Anesthetic plan and risks discussed with patient  signed consent obtained      Use of blood products discussed with patient who consented to blood products.      Patient's NPO status is appropriate for Anesthesia.           Plan discussed with CRNA.

## 2023-04-09 NOTE — H&P (Signed)
 17/2025[facility]  H&P Update Form    Travis [name], Travis [name], 63 y.o. male  Encounter Start Date:  04/09/2023  Inpatient Admission Date:   Date of Birth:  Feb 11, 1961    04/09/2023    STOP: IF H&P IS GREATER THAN 30 DAYS FROM SURGICAL DAY COMPLETE NEW H&P IS REQUIRED.     H & P updated the day of the procedure.  1.  H&P completed within 30 days of surgical procedure and has been reviewed within 24 hours of admission but prior to surgery or a procedure requiring anesthesia services by Dr. Carlus Pavlov on 04/09/2023 , the patient has been examined, and no change has occured in the patients condition since the H&P was completed.       Change in medications: No              2.  Patient continues to be appropiate candidate for planned surgical procedure. YES      Silvestre Mesi, MD

## 2023-04-09 NOTE — Anesthesia Postprocedure Evaluation (Signed)
 Anesthesia Post Op Evaluation    Patient: Travis [name]  Procedure(s):  COLONOSCOPY    Last Vitals:Temperature: 36.4 C (97.5 F) (04/09/23 0706)  Heart Rate: 88 (04/09/23 0706)  BP (Non-Invasive): 133/78 (04/09/23 0706)  Respiratory Rate: 16 (04/09/23 0706)  SpO2: 94 % (04/09/23 0706)    No notable events documented.    Patient is sufficiently recovered from the effects of anesthesia to participate in the evaluation and has returned to their pre-procedure level.  Patient location during evaluation: PACU       Patient participation: complete - patient participated  Level of consciousness: awake and alert and responsive to verbal stimuli    Pain score: 0  Pain management: adequate  Airway patency: patent    Anesthetic complications: no  Cardiovascular status: acceptable  Respiratory status: acceptable  Hydration status: acceptable  Patient post-procedure temperature: Pt Normothermic   PONV Status: Absent

## 2023-04-09 NOTE — Discharge Instructions (Signed)
 SURGICAL DISCHARGE INSTRUCTIONS     Dr. Delanna Ahmadi, Suszanne Conners, MD  performed your COLONOSCOPY today at [facility]     Dr. Carlus Pavlov     [phone]      PLEASE SEE WRITTEN HANDOUTS AS DISCUSSED BY YOUR NURSE:  Michael Boston, RN    SIGNS AND SYMPTOMS OF A WOUND / INCISION INFECTION   Be sure to watch for the following:  Increase in redness or red streaks near or around the wound or incision.  Increase in pain that is intense or severe and cannot be relieved by the pain medication that your doctor has given you.  Increase in swelling that cannot be relieved by elevation of a body part, or by applying ice, if permitted.  Increase in drainage, or if yellow / green in color and smells bad. This could be on a dressing or a cast.  Increase in fever for longer than 24 hours, or an increase that is higher than 101 degrees Fahrenheit (normal body temperature is 98 degrees Fahrenheit). The incision may feel warm to the touch.    **CALL YOUR DOCTOR IF ONE OR MORE OF THESE SIGNS / SYMPTOMS SHOULD OCCUR.    ANESTHESIA INFORMATION   ANESTHESIA -- ADULT PATIENTS:  You have received intravenous sedation / general anesthesia, and you may feel drowsy and light-headed for several hours. You may even experience some forgetfulness of the procedure. DO NOT DRIVE A MOTOR VEHICLE or perform any activity requiring complete alertness or coordination until you feel fully awake in about 24-48 hours. Do not drink alcoholic beverages for at least 24 hours. Do not stay alone, you must have a responsible adult available to be with you. You may also experience a dry mouth or nausea for 24 hours. This is a normal side effect and will disappear as the effects of the medication wear off.    REMEMBER   If you experience any difficulty breathing, chest pain, bleeding that you feel is excessive, persistent nausea or vomiting or for any other concerns:  Call your physician Dr.  Silvestre Mesi, MD at [phone] or [phone] You  may also ask to have the general doctor on call paged. They are available to you 24 hours a day.    SPECIAL INSTRUCTIONS / COMMENTS       FOLLOW-UP APPOINTMENTS   Follow up with Dr. Delanna Ahmadi March 4th at 12:00 in the [facility] office.

## 2023-04-09 NOTE — OR Surgeon (Signed)
 [facility]                                                     OPERATIVE NOTE    Patient Name: Travis [name], Travis [name] Number: [id number]  Date of Service: 04/09/2023   Date of Birth: Feb 14, 1961    All elements must be documented.    Attending Surgeon: Dr. Carlus Pavlov    Anesthesia Type: IV Sedation  Estimated Blood Loss:  Without  Complications (not routinely expected or not inherent to difficulty/nature of procedure):None  Characteristic Event (routinely expected or inherent to the difficulty/nature of the procedure): None  Did the use of current and/or prior Anticoagulants impact the outcome of the case? None    Specimens/ Cultures:  None    Operative Note:  Preoperative diagnosis screening family history.  Postoperative diagnosis diverticulosis minor hemorrhoids.  Operative procedure colonoscopy.  Operative note:  Patient exam table left-side dependent.  Digital rectal exam performed.  No mass abnormality encountered.  Fiberoptic colonoscopy gradually performed to the cecum.  Cecum ileocecal valve visualized.  Ascending, transverse, descending, sigmoid, rectosigmoid all inspected sequentially.  No exophytic lesions polyps or tumors.  Large concentration diverticuli in the descending and sigmoid segments.  Scope withdrawn air insufflation evacuated.  There was some minor external hemorrhoids present.  Patient tolerates procedure without difficulty.         Disposition: to PACU  Condition: Stable    Silvestre Mesi, MD

## 2023-05-17 ENCOUNTER — Ambulatory Visit (HOSPITAL_COMMUNITY)
Admission: RE | Admit: 2023-05-17 | Discharge: 2023-05-17 | Disposition: A | Discharge status: Home / Self Care | Source: Ambulatory Visit | Attending: UROLOGY | Admitting: UROLOGY

## 2023-05-17 ENCOUNTER — Inpatient Hospital Stay
Admission: RE | Admit: 2023-05-17 | Discharge: 2023-05-17 | Disposition: A | Discharge status: Home / Self Care | Payer: Self-pay | Source: Ambulatory Visit | Attending: UROLOGY | Admitting: UROLOGY

## 2023-05-17 ENCOUNTER — Other Ambulatory Visit: Payer: Self-pay

## 2023-05-17 DIAGNOSIS — N200 Calculus of kidney: Secondary | ICD-10-CM | POA: Insufficient documentation

## 2023-05-23 ENCOUNTER — Other Ambulatory Visit: Payer: Self-pay

## 2023-05-23 ENCOUNTER — Encounter (INDEPENDENT_AMBULATORY_CARE_PROVIDER_SITE_OTHER): Payer: Self-pay | Admitting: UROLOGY

## 2023-05-23 ENCOUNTER — Ambulatory Visit: Attending: UROLOGY | Admitting: UROLOGY

## 2023-05-23 VITALS — BP 144/78 | HR 98 | Temp 97.2°F | Ht 74.0 in | Wt 242.7 lb

## 2023-05-23 DIAGNOSIS — N200 Calculus of kidney: Secondary | ICD-10-CM | POA: Insufficient documentation

## 2023-05-23 DIAGNOSIS — Z87442 Personal history of urinary calculi: Secondary | ICD-10-CM

## 2023-05-23 DIAGNOSIS — N2889 Other specified disorders of kidney and ureter: Secondary | ICD-10-CM

## 2023-05-23 NOTE — Progress Notes (Signed)
 NAME: Travis [name]  AGE: 63 y.o.  DATE: 05/23/2023  SERVICE: Urology        Chief Complaint   Patient presents with    Other     Kidney stones f/u            S: This is a 63 y.o.  male here for follow up of urinary stones disease.  He is status post bilateral ureteroscopy with laser lithotripsy for bilateral ureteral stones in January 2023.  He was last here 1 year ago.  He reports being evaluated by Nephrology for stone prevention when he lived in [place] .  He declined offer for repeat metabolic workup.  He is here today for 1 year follow-up with imaging.    He is doing well with no new complaints.  He denies any stone symptoms.  No hematuria or dysuria.  He has mild slowing of the urinary stream but no other voiding complaints.    Past Medical History  Past Medical History:   Diagnosis Date    Diabetes mellitus, type 2     Gout, unspecified     History of kidney disease     HLD (hyperlipidemia)     HTN (hypertension)     they took me off of Lisinopril when I got out of the hospital-was taking due to family history    Kidney stones     Type 2 diabetes mellitus     Wears glasses            Past Surgical History  Past Surgical History:   Procedure Laterality Date    COLONOSCOPY      FINGER SURGERY      HX WISDOM TEETH EXTRACTION      LITHOTRIPSY             No Known Allergies    Current Outpatient Medications   Medication Instructions    cetirizine (ZYRTEC) 10 mg, Oral, Daily    cholecalciferol, vitamin D3, 50 mcg (2,000 unit) Oral Tablet 1 Capsule, Oral, DAILY    Febuxostat (ULORIC) 80 mg, Oral, Daily    fluticasone propionate (FLONASE) 50 mcg/actuation Nasal Spray, Suspension 1 Spray, Each Nostril, Daily    hyoscyamine  sulfate (LEVSIN) 0.125 mg, Oral, EVERY 6 HOURS PRN    lisinopriL (PRINIVIL) 10 mg, Daily    magnesium  oxide (MAG-OX) 400 mg, Oral, 2 TIMES DAILY    pioglitazone (ACTOS) 45 mg, Daily    rosuvastatin  (CRESTOR ) 10 mg, Oral, EVERY EVENING    SITagliptin phos-metformin (JANUMET XR) 50-1,000 mg  Oral Tab, Multiphasic Release 24 hr 1 Tablet, Oral, 2 TIMES DAILY        ROS:  Please see HPI.  All other systems were reviewed by me and are negative.                O:         PHYSICAL EXAM:    BP (!) 144/78 (Patient Position: Sitting) Comment: manual  Pulse 98   Temp 36.2 C (97.2 F)   Ht 1.88 m (6\' 2" )   Wt 110 kg (242 lb 11.6 oz)   BMI 31.16 kg/m       General:  NAD.  Vital signs reviewed.   Skin:  Warm, dry, and without suspicious lesion.    HEENT:  Head is normocephalic.  Hearing adequate for conversation.   Pulmonary:  Respirations unlabored.  No audible wheezes.     Cardiovascular:  No JVD.   MS:  Ambulates without difficulty.  No  CVAT.  GI : The abdomen is soft, nontender and nondistended.       Labs:  WBC   Date Value Ref Range Status   02/13/2022 7.3 3.7 - 11.0 x10^3/uL Final     HGB   Date Value Ref Range Status   02/13/2022 10.5 (L) 13.4 - 17.5 g/dL Final     HEMOGLOBIN J4N   Date Value Ref Range Status   02/08/2022 7.1 (H) 4.0 - 5.6 % Final     CREATININE   Date Value Ref Range Status   02/15/2022 4.78 (H) 0.75 - 1.35 mg/dL Final     82/95/6213 outside creatinine 1.23   08/30/2022-outside PSA 0.7      Urine:   U/A-  Urine Dip Results:  No results found for this or any previous visit (from the past 12 hours).      Diagnostic Studies-  KUB from 05/17/2023 films reviewed.  Our impression is no obvious renal stones.      Renal ultrasound from same date films reviewed.  Our impression is no evidence of hydronephrosis.  There are a couple of hyperdensities in the left kidney that could represent small nonobstructing stones although there was no significant shadowing.      A/P:  Please see Dr. Zenon Hilda note for assessment and plan.    No orders of the defined types were placed in this encounter.      Patient seen as a shared visit, attending physician present in the clinic.     Travis [name]  05/23/2023, 08:47   Travis Jansky, APRN  Travis Chanie Soucek, MD  [place]  [facility]  Medicine  Department of Urology  Phone- [phone]  Fax- [phone]     I personally saw and evaluated the patient. See mid-level's note for additional details. My findings/participation are:     Assessment:   Bilateral [name]'s Plaques       Plan:   I reviewed the imaging with him.  No sizeable stones were present.  He has [name]'s Plaques that appear stable in size.    I discussed basic stone prevention with dietary changes including increasing fluid intake to ensure 3 liters of urine output, decreasing salt to less than 1 teaspoon, decreasing animal protein, and increasing citric acid intake.  RTC in 1 year with a kidney US  and KUB     Travis Edwin Shalana Jardin, MD

## 2024-05-28 ENCOUNTER — Ambulatory Visit (INDEPENDENT_AMBULATORY_CARE_PROVIDER_SITE_OTHER): Payer: Self-pay | Admitting: UROLOGY

## 2024-06-25 ENCOUNTER — Ambulatory Visit (INDEPENDENT_AMBULATORY_CARE_PROVIDER_SITE_OTHER): Admitting: UROLOGY
